# Patient Record
Sex: Female | Born: 1940 | Race: White | Hispanic: No | Marital: Married | Smoking: Never smoker
Health system: Southern US, Community
[De-identification: ages and names within clinical notes are randomized; demographics above are authoritative.]

## PROBLEM LIST (undated history)

## (undated) DIAGNOSIS — J45909 Unspecified asthma, uncomplicated: Secondary | ICD-10-CM

## (undated) DIAGNOSIS — K219 Gastro-esophageal reflux disease without esophagitis: Secondary | ICD-10-CM

## (undated) DIAGNOSIS — H409 Unspecified glaucoma: Secondary | ICD-10-CM

## (undated) DIAGNOSIS — R42 Dizziness and giddiness: Secondary | ICD-10-CM

## (undated) DIAGNOSIS — G43909 Migraine, unspecified, not intractable, without status migrainosus: Secondary | ICD-10-CM

## (undated) DIAGNOSIS — E7800 Pure hypercholesterolemia, unspecified: Secondary | ICD-10-CM

## (undated) DIAGNOSIS — E039 Hypothyroidism, unspecified: Secondary | ICD-10-CM

## (undated) DIAGNOSIS — I10 Essential (primary) hypertension: Secondary | ICD-10-CM

## (undated) DIAGNOSIS — J449 Chronic obstructive pulmonary disease, unspecified: Secondary | ICD-10-CM

## (undated) DIAGNOSIS — R0600 Dyspnea, unspecified: Secondary | ICD-10-CM

## (undated) DIAGNOSIS — M810 Age-related osteoporosis without current pathological fracture: Secondary | ICD-10-CM

## (undated) DIAGNOSIS — I4891 Unspecified atrial fibrillation: Secondary | ICD-10-CM

## (undated) HISTORY — PX: COLONOSCOPY: SHX174

## (undated) HISTORY — PX: ABLATION: SHX5711

---

## 1999-05-28 ENCOUNTER — Ambulatory Visit (HOSPITAL_COMMUNITY): Admission: RE | Admit: 1999-05-28 | Discharge: 1999-05-28 | Payer: Self-pay | Admitting: Internal Medicine

## 2004-11-05 ENCOUNTER — Ambulatory Visit: Payer: Self-pay | Admitting: Internal Medicine

## 2004-11-19 ENCOUNTER — Ambulatory Visit: Payer: Self-pay | Admitting: Obstetrics and Gynecology

## 2005-09-22 ENCOUNTER — Ambulatory Visit: Payer: Self-pay | Admitting: Internal Medicine

## 2005-12-31 ENCOUNTER — Ambulatory Visit: Payer: Self-pay | Admitting: Obstetrics and Gynecology

## 2006-03-01 ENCOUNTER — Ambulatory Visit: Payer: Self-pay

## 2007-02-17 ENCOUNTER — Ambulatory Visit: Payer: Self-pay | Admitting: Obstetrics and Gynecology

## 2008-03-24 ENCOUNTER — Ambulatory Visit: Payer: Self-pay | Admitting: Internal Medicine

## 2008-03-27 ENCOUNTER — Ambulatory Visit: Payer: Self-pay | Admitting: Obstetrics and Gynecology

## 2010-04-24 ENCOUNTER — Ambulatory Visit: Payer: Self-pay | Admitting: Unknown Physician Specialty

## 2010-08-19 ENCOUNTER — Ambulatory Visit: Payer: Self-pay | Admitting: Internal Medicine

## 2010-08-25 ENCOUNTER — Ambulatory Visit: Payer: Self-pay | Admitting: Internal Medicine

## 2010-09-02 ENCOUNTER — Ambulatory Visit: Payer: Self-pay | Admitting: Internal Medicine

## 2010-12-01 ENCOUNTER — Ambulatory Visit: Payer: Self-pay | Admitting: Internal Medicine

## 2011-04-21 ENCOUNTER — Ambulatory Visit: Payer: Self-pay | Admitting: Gastroenterology

## 2011-12-24 ENCOUNTER — Ambulatory Visit: Payer: Self-pay | Admitting: Internal Medicine

## 2013-05-12 ENCOUNTER — Ambulatory Visit: Payer: Self-pay | Admitting: Internal Medicine

## 2014-02-16 ENCOUNTER — Encounter: Payer: Self-pay | Admitting: Sports Medicine

## 2014-03-13 ENCOUNTER — Encounter: Payer: Self-pay | Admitting: Sports Medicine

## 2016-05-21 ENCOUNTER — Encounter: Payer: Self-pay | Admitting: *Deleted

## 2016-05-21 ENCOUNTER — Ambulatory Visit: Payer: Medicare Other | Admitting: Anesthesiology

## 2016-05-21 ENCOUNTER — Encounter
Admission: RE | Disposition: A | Discharge status: Home / Self Care | Payer: Self-pay | Source: Ambulatory Visit | Attending: Internal Medicine

## 2016-05-21 ENCOUNTER — Ambulatory Visit
Admission: RE | Admit: 2016-05-21 | Discharge: 2016-05-21 | Disposition: A | Discharge status: Home / Self Care | Payer: Medicare Other | Source: Ambulatory Visit | Attending: Internal Medicine | Admitting: Internal Medicine

## 2016-05-21 DIAGNOSIS — Z8249 Family history of ischemic heart disease and other diseases of the circulatory system: Secondary | ICD-10-CM | POA: Insufficient documentation

## 2016-05-21 DIAGNOSIS — E785 Hyperlipidemia, unspecified: Secondary | ICD-10-CM | POA: Diagnosis not present

## 2016-05-21 DIAGNOSIS — J449 Chronic obstructive pulmonary disease, unspecified: Secondary | ICD-10-CM | POA: Diagnosis not present

## 2016-05-21 DIAGNOSIS — I480 Paroxysmal atrial fibrillation: Secondary | ICD-10-CM | POA: Diagnosis present

## 2016-05-21 DIAGNOSIS — Z882 Allergy status to sulfonamides status: Secondary | ICD-10-CM | POA: Diagnosis not present

## 2016-05-21 DIAGNOSIS — K219 Gastro-esophageal reflux disease without esophagitis: Secondary | ICD-10-CM | POA: Insufficient documentation

## 2016-05-21 DIAGNOSIS — Z7951 Long term (current) use of inhaled steroids: Secondary | ICD-10-CM | POA: Insufficient documentation

## 2016-05-21 DIAGNOSIS — E039 Hypothyroidism, unspecified: Secondary | ICD-10-CM | POA: Insufficient documentation

## 2016-05-21 DIAGNOSIS — Z820 Family history of epilepsy and other diseases of the nervous system: Secondary | ICD-10-CM | POA: Insufficient documentation

## 2016-05-21 DIAGNOSIS — Z7901 Long term (current) use of anticoagulants: Secondary | ICD-10-CM | POA: Diagnosis not present

## 2016-05-21 DIAGNOSIS — Z888 Allergy status to other drugs, medicaments and biological substances status: Secondary | ICD-10-CM | POA: Insufficient documentation

## 2016-05-21 DIAGNOSIS — Z79899 Other long term (current) drug therapy: Secondary | ICD-10-CM | POA: Diagnosis not present

## 2016-05-21 DIAGNOSIS — I10 Essential (primary) hypertension: Secondary | ICD-10-CM | POA: Diagnosis not present

## 2016-05-21 DIAGNOSIS — M810 Age-related osteoporosis without current pathological fracture: Secondary | ICD-10-CM | POA: Diagnosis not present

## 2016-05-21 DIAGNOSIS — Z881 Allergy status to other antibiotic agents status: Secondary | ICD-10-CM | POA: Insufficient documentation

## 2016-05-21 HISTORY — DX: Unspecified asthma, uncomplicated: J45.909

## 2016-05-21 HISTORY — DX: Hypothyroidism, unspecified: E03.9

## 2016-05-21 HISTORY — DX: Dyspnea, unspecified: R06.00

## 2016-05-21 HISTORY — PX: CARDIOVERSION: EP1203

## 2016-05-21 HISTORY — DX: Pure hypercholesterolemia, unspecified: E78.00

## 2016-05-21 HISTORY — DX: Essential (primary) hypertension: I10

## 2016-05-21 HISTORY — DX: Age-related osteoporosis without current pathological fracture: M81.0

## 2016-05-21 HISTORY — DX: Unspecified atrial fibrillation: I48.91

## 2016-05-21 HISTORY — DX: Chronic obstructive pulmonary disease, unspecified: J44.9

## 2016-05-21 HISTORY — DX: Gastro-esophageal reflux disease without esophagitis: K21.9

## 2016-05-21 HISTORY — DX: Unspecified glaucoma: H40.9

## 2016-05-21 SURGERY — CARDIOVERSION (CATH LAB)
Anesthesia: General

## 2016-05-21 MED ORDER — PROPOFOL 10 MG/ML IV BOLUS
INTRAVENOUS | Status: DC | PRN
  Administered 2016-05-21: 75 mg via INTRAVENOUS

## 2016-05-21 MED ORDER — FENTANYL CITRATE (PF) 100 MCG/2ML IJ SOLN: 25.0000 ug | INTRAMUSCULAR | Status: DC | PRN

## 2016-05-21 MED ORDER — METOPROLOL SUCCINATE ER 25 MG PO TB24: 12.5000 mg | ORAL_TABLET | Freq: Two times a day (BID) | ORAL | 4 refills | Status: DC

## 2016-05-21 MED ORDER — ONDANSETRON HCL 4 MG/2ML IJ SOLN: 4.0000 mg | Freq: Once | INTRAMUSCULAR | Status: DC | PRN

## 2016-05-21 MED ORDER — FENTANYL CITRATE (PF) 100 MCG/2ML IJ SOLN
INTRAMUSCULAR | Status: AC
  Filled 2016-05-21: qty 2

## 2016-05-21 MED ORDER — SODIUM CHLORIDE 0.9 % IV SOLN
INTRAVENOUS | Status: DC
  Administered 2016-05-21 (×2): via INTRAVENOUS

## 2016-05-21 NOTE — Anesthesia Postprocedure Evaluation (Signed)
Anesthesia Post Note  Patient: Sarah Herring  Procedure(s) Performed: Procedure(s) (LRB): CARDIOVERSION (N/A)  Patient location during evaluation: PACU Anesthesia Type: General Level of consciousness: awake Pain management: pain level controlled Vital Signs Assessment: post-procedure vital signs reviewed and stable Respiratory status: spontaneous breathing Cardiovascular status: stable Anesthetic complications: no     Last Vitals:  Vitals:   05/21/16 0825 05/21/16 0830  BP: 112/62 107/82  Pulse: (!) 57 (!) 59  Resp: 17 17  Temp:      Last Pain: There were no vitals filed for this visit.               VAN STAVEREN,Braxxton Stoudt

## 2016-05-21 NOTE — Transfer of Care (Signed)
Immediate Anesthesia Transfer of Care Note  Patient: Sarah FilbertCarol S Weintraub  Procedure(s) Performed: Procedure(s): CARDIOVERSION (N/A)  Patient Location: PACU  Anesthesia Type:General  Level of Consciousness: awake and patient cooperative  Airway & Oxygen Therapy: Patient Spontanous Breathing and Patient connected to nasal cannula oxygen  Post-op Assessment: Report given to RN and Post -op Vital signs reviewed and stable  Post vital signs: Reviewed and stable  Last Vitals:  Vitals:   05/21/16 0821 05/21/16 0822  BP:    Pulse: 63 65  Resp: (!) 25 20  Temp:      Last Pain: There were no vitals filed for this visit.       Complications: No apparent anesthesia complications

## 2016-05-21 NOTE — Anesthesia Preprocedure Evaluation (Signed)
Anesthesia Evaluation  Patient identified by MRN, date of birth, ID band Patient awake    Reviewed: Allergy & Precautions  Airway Mallampati: III       Dental  (+) Caps, Teeth Intact   Pulmonary shortness of breath and with exertion,     + decreased breath sounds      Cardiovascular hypertension, Pt. on home beta blockers + dysrhythmias Atrial Fibrillation  Rhythm:Irregular Rate:Abnormal     Neuro/Psych negative neurological ROS  negative psych ROS   GI/Hepatic Neg liver ROS, GERD  Medicated,  Endo/Other  Hypothyroidism   Renal/GU      Musculoskeletal   Abdominal Normal abdominal exam  (+) + obese,   Peds  Hematology negative hematology ROS (+)   Anesthesia Other Findings   Reproductive/Obstetrics                             Anesthesia Physical Anesthesia Plan  ASA: III  Anesthesia Plan: General   Post-op Pain Management:    Induction: Intravenous  Airway Management Planned: Natural Airway and Nasal Cannula  Additional Equipment:   Intra-op Plan:   Post-operative Plan:   Informed Consent: I have reviewed the patients History and Physical, chart, labs and discussed the procedure including the risks, benefits and alternatives for the proposed anesthesia with the patient or authorized representative who has indicated his/her understanding and acceptance.     Plan Discussed with: CRNA and Surgeon  Anesthesia Plan Comments:         Anesthesia Quick Evaluation

## 2016-05-21 NOTE — CV Procedure (Signed)
Electrical Cardioversion Procedure Note Emily FilbertCarol S Wubben [id number] 04/21/1940  Procedure: Electrical Cardioversion Indications:  Atrial Fibrillation  Procedure Details Consent: Risks of procedure as well as the alternatives and risks of each were explained to the (patient/caregiver).  Consent for procedure obtained. Time Out: Verified patient identification, verified procedure, site/side was marked, verified correct patient position, special equipment/implants available, medications/allergies/relevent history reviewed, required imaging and test results available.  Performed  Patient placed on cardiac monitor, pulse oximetry, supplemental oxygen as necessary.  Sedation given: Benzodiazepines and Short-acting barbiturates Pacer pads placed anterior and posterior chest.  Cardioverted 1 time(s).  Cardioverted at 120J.  Evaluation Findings: Post procedure EKG shows: NSR Complications: None Patient did tolerate procedure well.   Lamar BlinksBruce J Kowalski 05/21/2016, 8:36 AM

## 2016-05-21 NOTE — Anesthesia Post-op Follow-up Note (Cosign Needed)
Anesthesia QCDR form completed.        

## 2017-10-21 ENCOUNTER — Ambulatory Visit: Admit: 2017-10-21 | Payer: Medicare Other | Admitting: Internal Medicine

## 2017-10-21 SURGERY — CARDIOVERSION (CATH LAB)
Anesthesia: General

## 2017-12-07 ENCOUNTER — Other Ambulatory Visit: Payer: Self-pay | Admitting: Internal Medicine

## 2017-12-07 DIAGNOSIS — G8929 Other chronic pain: Secondary | ICD-10-CM

## 2017-12-07 DIAGNOSIS — M545 Low back pain, unspecified: Secondary | ICD-10-CM

## 2017-12-22 ENCOUNTER — Encounter (INDEPENDENT_AMBULATORY_CARE_PROVIDER_SITE_OTHER): Payer: Self-pay

## 2017-12-22 ENCOUNTER — Ambulatory Visit
Admission: RE | Admit: 2017-12-22 | Discharge: 2017-12-22 | Disposition: A | Discharge status: Home / Self Care | Payer: Medicare Other | Source: Ambulatory Visit | Attending: Internal Medicine | Admitting: Internal Medicine

## 2017-12-22 DIAGNOSIS — M545 Low back pain, unspecified: Secondary | ICD-10-CM

## 2017-12-22 DIAGNOSIS — G8929 Other chronic pain: Secondary | ICD-10-CM | POA: Insufficient documentation

## 2017-12-22 DIAGNOSIS — M4854XA Collapsed vertebra, not elsewhere classified, thoracic region, initial encounter for fracture: Secondary | ICD-10-CM | POA: Diagnosis not present

## 2017-12-22 DIAGNOSIS — R29890 Loss of height: Secondary | ICD-10-CM | POA: Diagnosis not present

## 2017-12-22 MED ORDER — GADOBENATE DIMEGLUMINE 529 MG/ML IV SOLN
15.0000 mL | Freq: Once | INTRAVENOUS | Status: AC | PRN
  Administered 2017-12-22: 15 mL via INTRAVENOUS

## 2017-12-30 ENCOUNTER — Encounter: Payer: Self-pay | Admitting: *Deleted

## 2017-12-30 ENCOUNTER — Ambulatory Visit
Admission: RE | Admit: 2017-12-30 | Discharge: 2017-12-30 | Disposition: A | Discharge status: Home / Self Care | Payer: Medicare Other | Source: Ambulatory Visit | Attending: Orthopedic Surgery | Admitting: Orthopedic Surgery

## 2017-12-30 ENCOUNTER — Other Ambulatory Visit: Payer: Self-pay

## 2017-12-30 ENCOUNTER — Encounter
Admission: RE | Disposition: A | Discharge status: Home / Self Care | Payer: Self-pay | Source: Ambulatory Visit | Attending: Orthopedic Surgery

## 2017-12-30 ENCOUNTER — Ambulatory Visit: Payer: Medicare Other | Admitting: Anesthesiology

## 2017-12-30 ENCOUNTER — Ambulatory Visit: Payer: Medicare Other

## 2017-12-30 DIAGNOSIS — Z79899 Other long term (current) drug therapy: Secondary | ICD-10-CM | POA: Insufficient documentation

## 2017-12-30 DIAGNOSIS — X509XXA Other and unspecified overexertion or strenuous movements or postures, initial encounter: Secondary | ICD-10-CM | POA: Diagnosis not present

## 2017-12-30 DIAGNOSIS — I10 Essential (primary) hypertension: Secondary | ICD-10-CM | POA: Insufficient documentation

## 2017-12-30 DIAGNOSIS — Z7901 Long term (current) use of anticoagulants: Secondary | ICD-10-CM | POA: Diagnosis not present

## 2017-12-30 DIAGNOSIS — S22080A Wedge compression fracture of T11-T12 vertebra, initial encounter for closed fracture: Secondary | ICD-10-CM | POA: Diagnosis not present

## 2017-12-30 DIAGNOSIS — G4730 Sleep apnea, unspecified: Secondary | ICD-10-CM | POA: Diagnosis not present

## 2017-12-30 DIAGNOSIS — J449 Chronic obstructive pulmonary disease, unspecified: Secondary | ICD-10-CM | POA: Diagnosis not present

## 2017-12-30 DIAGNOSIS — Z79890 Hormone replacement therapy (postmenopausal): Secondary | ICD-10-CM | POA: Diagnosis not present

## 2017-12-30 DIAGNOSIS — E039 Hypothyroidism, unspecified: Secondary | ICD-10-CM | POA: Diagnosis not present

## 2017-12-30 DIAGNOSIS — Z419 Encounter for procedure for purposes other than remedying health state, unspecified: Secondary | ICD-10-CM

## 2017-12-30 DIAGNOSIS — E119 Type 2 diabetes mellitus without complications: Secondary | ICD-10-CM | POA: Insufficient documentation

## 2017-12-30 DIAGNOSIS — E785 Hyperlipidemia, unspecified: Secondary | ICD-10-CM | POA: Insufficient documentation

## 2017-12-30 HISTORY — PX: KYPHOPLASTY: SHX5884

## 2017-12-30 SURGERY — KYPHOPLASTY
Anesthesia: Monitor Anesthesia Care | Site: Back

## 2017-12-30 MED ORDER — LACTATED RINGERS IV SOLN
INTRAVENOUS | Status: DC
  Administered 2017-12-30: 13:00:00 via INTRAVENOUS

## 2017-12-30 MED ORDER — BUPIVACAINE-EPINEPHRINE (PF) 0.5% -1:200000 IJ SOLN
INTRAMUSCULAR | Status: AC
  Filled 2017-12-30: qty 30

## 2017-12-30 MED ORDER — IOPAMIDOL (ISOVUE-M 200) INJECTION 41%
INTRAMUSCULAR | Status: AC
  Filled 2017-12-30: qty 20

## 2017-12-30 MED ORDER — IOPAMIDOL (ISOVUE-M 200) INJECTION 41%
INTRAMUSCULAR | Status: DC | PRN
  Administered 2017-12-30: 20 mL

## 2017-12-30 MED ORDER — MIDAZOLAM HCL 2 MG/2ML IJ SOLN
INTRAMUSCULAR | Status: AC
  Filled 2017-12-30: qty 2

## 2017-12-30 MED ORDER — PROPOFOL 10 MG/ML IV BOLUS
INTRAVENOUS | Status: DC | PRN
  Administered 2017-12-30 (×3): 20 mg via INTRAVENOUS

## 2017-12-30 MED ORDER — PROPOFOL 500 MG/50ML IV EMUL
INTRAVENOUS | Status: AC
  Filled 2017-12-30: qty 50

## 2017-12-30 MED ORDER — CEFAZOLIN SODIUM-DEXTROSE 2-4 GM/100ML-% IV SOLN
INTRAVENOUS | Status: AC
  Filled 2017-12-30: qty 100

## 2017-12-30 MED ORDER — LIDOCAINE HCL 1 % IJ SOLN
INTRAMUSCULAR | Status: DC | PRN
  Administered 2017-12-30: 30 mL

## 2017-12-30 MED ORDER — CEFAZOLIN SODIUM-DEXTROSE 2-4 GM/100ML-% IV SOLN
2.0000 g | Freq: Once | INTRAVENOUS | Status: AC
  Administered 2017-12-30: 2 g via INTRAVENOUS

## 2017-12-30 MED ORDER — MIDAZOLAM HCL 2 MG/2ML IJ SOLN
INTRAMUSCULAR | Status: DC | PRN
  Administered 2017-12-30 (×2): 1 mg via INTRAVENOUS

## 2017-12-30 MED ORDER — LIDOCAINE HCL (CARDIAC) PF 100 MG/5ML IV SOSY
PREFILLED_SYRINGE | INTRAVENOUS | Status: DC | PRN
  Administered 2017-12-30: 50 mg via INTRAVENOUS

## 2017-12-30 MED ORDER — ONDANSETRON HCL 4 MG/2ML IJ SOLN: 4.0000 mg | Freq: Once | INTRAMUSCULAR | Status: DC | PRN

## 2017-12-30 MED ORDER — BUPIVACAINE-EPINEPHRINE (PF) 0.5% -1:200000 IJ SOLN
INTRAMUSCULAR | Status: DC | PRN
  Administered 2017-12-30: 20 mL

## 2017-12-30 MED ORDER — LIDOCAINE HCL (PF) 1 % IJ SOLN
INTRAMUSCULAR | Status: AC
  Filled 2017-12-30: qty 30

## 2017-12-30 MED ORDER — PROPOFOL 500 MG/50ML IV EMUL
INTRAVENOUS | Status: DC | PRN
  Administered 2017-12-30: 50 ug/kg/min via INTRAVENOUS

## 2017-12-30 MED ORDER — HYDROCODONE-ACETAMINOPHEN 5-325 MG PO TABS: 1.0000 | ORAL_TABLET | Freq: Four times a day (QID) | ORAL | 0 refills | Status: DC | PRN

## 2017-12-30 MED ORDER — FENTANYL CITRATE (PF) 100 MCG/2ML IJ SOLN: 25.0000 ug | INTRAMUSCULAR | Status: DC | PRN

## 2017-12-30 SURGICAL SUPPLY — 16 items
CEMENT KYPHON CX01A KIT/MIXER (Cement) ×2 IMPLANT
DERMABOND ADVANCED (GAUZE/BANDAGES/DRESSINGS) ×1
DERMABOND ADVANCED .7 DNX12 (GAUZE/BANDAGES/DRESSINGS) ×1 IMPLANT
DEVICE BIOPSY BONE KYPHX (INSTRUMENTS) ×2 IMPLANT
DRAPE C-ARM XRAY 36X54 (DRAPES) ×2 IMPLANT
DURAPREP 26ML APPLICATOR (WOUND CARE) ×2 IMPLANT
GLOVE SURG SYN 9.0  PF PI (GLOVE) ×1
GLOVE SURG SYN 9.0 PF PI (GLOVE) ×1 IMPLANT
GOWN SRG 2XL LVL 4 RGLN SLV (GOWNS) ×1 IMPLANT
GOWN STRL NON-REIN 2XL LVL4 (GOWNS) ×1
GOWN STRL REUS W/ TWL LRG LVL3 (GOWN DISPOSABLE) ×1 IMPLANT
GOWN STRL REUS W/TWL LRG LVL3 (GOWN DISPOSABLE) ×1
PACK KYPHOPLASTY (MISCELLANEOUS) ×2 IMPLANT
STRAP SAFETY 5IN WIDE (MISCELLANEOUS) ×2 IMPLANT
TRAY KYPHOPAK 15/3 EXPRESS 1ST (MISCELLANEOUS) ×2 IMPLANT
TRAY KYPHOPAK 20/3 EXPRESS 1ST (MISCELLANEOUS) ×1 IMPLANT

## 2017-12-30 NOTE — Transfer of Care (Signed)
Immediate Anesthesia Transfer of Care Note  Patient: Sarah Herring  Procedure(s) Performed: Coralyn Helling (N/A Back)  Patient Location: PACU  Anesthesia Type:MAC  Level of Consciousness: awake  Airway & Oxygen Therapy: Patient Spontanous Breathing  Post-op Assessment: Report given to RN  Post vital signs: stable  Last Vitals:  Vitals Value Taken Time  BP 161/71 12/30/2017  2:07 PM  Temp 36.4 C 12/30/2017  2:07 PM  Pulse 62 12/30/2017  2:13 PM  Resp 11 12/30/2017  2:13 PM  SpO2 98 % 12/30/2017  2:13 PM  Vitals shown include unvalidated device data.  Last Pain:  Vitals:   12/30/17 1407  TempSrc:   PainSc: 0-No pain         Complications: No apparent anesthesia complications

## 2017-12-30 NOTE — Op Note (Signed)
12/30/2017  2:10 PM  PATIENT:  Sarah Herring  77 y.o. female  PRE-OPERATIVE DIAGNOSIS:  CLOSED WEDGE COMPRESSION FRACTURE OF TWELFTH [name]  POST-OPERATIVE DIAGNOSIS:  CLOSED WEDGE COMPRESSION FRACTURE OF TWELFTH [name]  PROCEDURE:  Procedure(s): KYPHOPLASTY-T12 (N/A)  SURGEON: Laurene Footman, MD  ASSISTANTS: None  ANESTHESIA:   local and MAC  EBL:  No intake/output data recorded.  BLOOD ADMINISTERED:none  DRAINS: none   LOCAL MEDICATIONS USED:  MARCAINE    and XYLOCAINE   SPECIMEN:  Source of Specimen:  T12 vertebral body  DISPOSITION OF SPECIMEN:  PATHOLOGY  COUNTS:  YES  TOURNIQUET:  * No tourniquets in log *  IMPLANTS: Bone cement  DICTATION: .Dragon Dictation patient was brought to the operating room and after adequate anesthesia was obtained the patient was placed prone.  After patient identification and timeout procedure with good visualization of T12 with both AP and lateral C arm views 5 cc 1% Xylocaine is infiltrated bilaterally subcutaneously.  The back was prepped and draped you sterile fashion and repeat timeout procedure carried out.  A 50-50 mix of 1% Xylocaine half percent Sensorcaine was infiltrated down the pedicle on the right and left sides of T12.  A small incision was made and trocar was advanced in a perpendicular alignment.  Biopsy was obtained drilling carried out and balloons were inflated on both sides.  About 2-1/2 cc of balloon inflation.  Cement was mixed and approximately 3 cc were used to fill the vertebral body from inferior endplate to superior with a small amount extravasation into the T11-T12 disc space.  There is good fill from medial to lateral as well on both sides.  When the cement was adequately set the trochars removed and Dermabond used to close the skin  PLAN OF CARE: Discharge to home after PACU  PATIENT DISPOSITION:  PACU - hemodynamically stable.

## 2017-12-30 NOTE — Discharge Instructions (Addendum)
Take it easy today and tomorrow.  Remove Band-Aids Saturday and then resume more normal activities if tolerated.  Okay to shower starting Saturday.  Pain medicine as directed. Resume home medications tonight.  AMBULATORY SURGERY  DISCHARGE INSTRUCTIONS   1) The drugs that you were given will stay in your system until tomorrow so for the next 24 hours you should not:  A) Drive an automobile B) Make any legal decisions C) Drink any alcoholic beverage   2) You may resume regular meals tomorrow.  Today it is better to start with liquids and gradually work up to solid foods.  You may eat anything you prefer, but it is better to start with liquids, then soup and crackers, and gradually work up to solid foods.   3) Please notify your doctor immediately if you have any unusual bleeding, trouble breathing, redness and pain at the surgery site, drainage, fever, or pain not relieved by medication.    4) Additional Instructions:        Please contact your physician with any problems or Same Day Surgery at [phone], Monday through Friday 6 am to 4 pm, or [place] at [facility] Main number at [phone].

## 2017-12-30 NOTE — Anesthesia Preprocedure Evaluation (Signed)
Anesthesia Evaluation  Patient identified by MRN, date of birth, ID band Patient awake    Reviewed: Allergy & Precautions, H&P , NPO status , Patient's Chart, lab work & pertinent test results, reviewed documented beta blocker date and time   Airway Mallampati: II  TM Distance: >3 FB Neck ROM: full    Dental no notable dental hx. (+) Teeth Intact   Pulmonary neg pulmonary ROS, shortness of breath and with exertion, asthma , COPD,    Pulmonary exam normal breath sounds clear to auscultation       Cardiovascular Exercise Tolerance: Poor hypertension, On Medications negative cardio ROS  Atrial Fibrillation  Rhythm:regular Rate:Normal     Neuro/Psych negative neurological ROS  negative psych ROS   GI/Hepatic negative GI ROS, Neg liver ROS, GERD  ,  Endo/Other  negative endocrine ROSdiabetesHypothyroidism   Renal/GU      Musculoskeletal   Abdominal   Peds  Hematology negative hematology ROS (+)   Anesthesia Other Findings   Reproductive/Obstetrics negative OB ROS                             Anesthesia Physical Anesthesia Plan  ASA: III  Anesthesia Plan: MAC   Post-op Pain Management:    Induction:   PONV Risk Score and Plan:   Airway Management Planned:   Additional Equipment:   Intra-op Plan:   Post-operative Plan:   Informed Consent: I have reviewed the patients History and Physical, chart, labs and discussed the procedure including the risks, benefits and alternatives for the proposed anesthesia with the patient or authorized representative who has indicated his/her understanding and acceptance.     Plan Discussed with: CRNA  Anesthesia Plan Comments:         Anesthesia Quick Evaluation

## 2017-12-30 NOTE — Anesthesia Post-op Follow-up Note (Signed)
Anesthesia QCDR form completed.        

## 2017-12-30 NOTE — H&P (Signed)
Reviewed paper H+P, will be scanned into chart. No changes noted.  

## 2017-12-30 NOTE — Anesthesia Postprocedure Evaluation (Signed)
Anesthesia Post Note  Patient: Sarah Herring  Procedure(s) Performed: Coralyn Helling (N/A Back)  Patient location during evaluation: PACU Anesthesia Type: MAC Level of consciousness: awake and alert Pain management: pain level controlled Vital Signs Assessment: post-procedure vital signs reviewed and stable Respiratory status: spontaneous breathing, nonlabored ventilation, respiratory function stable and patient connected to nasal cannula oxygen Cardiovascular status: blood pressure returned to baseline and stable Postop Assessment: no apparent nausea or vomiting Anesthetic complications: no     Last Vitals:  Vitals:   12/30/17 1202 12/30/17 1407  BP: (!) 171/76 (!) 161/71  Pulse: (!) 51 65  Resp: 20 12  Temp: (!) 35.6 C 36.4 C  SpO2: 100% 96%    Last Pain:  Vitals:   12/30/17 1407  TempSrc:   PainSc: 0-No pain                 Molli Barrows

## 2017-12-31 ENCOUNTER — Encounter: Payer: Self-pay | Admitting: Orthopedic Surgery

## 2018-01-03 LAB — SURGICAL PATHOLOGY

## 2018-03-21 ENCOUNTER — Other Ambulatory Visit: Payer: Self-pay | Admitting: Internal Medicine

## 2018-03-21 DIAGNOSIS — R1011 Right upper quadrant pain: Secondary | ICD-10-CM

## 2018-03-25 ENCOUNTER — Ambulatory Visit
Admission: RE | Admit: 2018-03-25 | Discharge: 2018-03-25 | Disposition: A | Discharge status: Home / Self Care | Payer: Medicare Other | Source: Ambulatory Visit | Attending: Internal Medicine | Admitting: Internal Medicine

## 2018-03-25 DIAGNOSIS — I10 Essential (primary) hypertension: Secondary | ICD-10-CM | POA: Diagnosis present

## 2018-03-25 DIAGNOSIS — R1011 Right upper quadrant pain: Secondary | ICD-10-CM | POA: Insufficient documentation

## 2018-04-14 ENCOUNTER — Other Ambulatory Visit: Payer: Self-pay | Admitting: Gastroenterology

## 2018-04-14 DIAGNOSIS — R1032 Left lower quadrant pain: Secondary | ICD-10-CM

## 2018-04-20 ENCOUNTER — Ambulatory Visit: Payer: Medicare Other

## 2018-04-22 ENCOUNTER — Ambulatory Visit
Admission: RE | Admit: 2018-04-22 | Discharge: 2018-04-22 | Disposition: A | Discharge status: Home / Self Care | Payer: Medicare Other | Source: Ambulatory Visit | Attending: Gastroenterology | Admitting: Gastroenterology

## 2018-04-22 DIAGNOSIS — R1032 Left lower quadrant pain: Secondary | ICD-10-CM | POA: Insufficient documentation

## 2018-04-22 MED ORDER — IOPAMIDOL (ISOVUE-300) INJECTION 61%
80.0000 mL | Freq: Once | INTRAVENOUS | Status: AC | PRN
  Administered 2018-04-22: 80 mL via INTRAVENOUS

## 2018-05-02 ENCOUNTER — Other Ambulatory Visit
Admission: RE | Admit: 2018-05-02 | Discharge: 2018-05-02 | Disposition: A | Discharge status: Home / Self Care | Payer: Medicare Other | Source: Ambulatory Visit | Attending: Gastroenterology | Admitting: Gastroenterology

## 2018-05-02 DIAGNOSIS — R109 Unspecified abdominal pain: Secondary | ICD-10-CM | POA: Diagnosis present

## 2018-05-02 LAB — GASTROINTESTINAL PANEL BY PCR, STOOL (REPLACES STOOL CULTURE)

## 2018-05-02 LAB — C DIFFICILE QUICK SCREEN W PCR REFLEX
C Diff antigen: NEGATIVE
C Diff interpretation: NOT DETECTED
C Diff toxin: NEGATIVE

## 2018-05-03 ENCOUNTER — Other Ambulatory Visit: Payer: Self-pay | Admitting: Gastroenterology

## 2018-05-03 DIAGNOSIS — R9389 Abnormal findings on diagnostic imaging of other specified body structures: Secondary | ICD-10-CM

## 2018-05-11 ENCOUNTER — Emergency Department: Payer: Medicare Other

## 2018-05-11 ENCOUNTER — Observation Stay
Admission: EM | Admit: 2018-05-11 | Discharge: 2018-05-12 | Disposition: A | Discharge status: Home / Self Care | Payer: Medicare Other | Attending: Internal Medicine | Admitting: Internal Medicine

## 2018-05-11 ENCOUNTER — Other Ambulatory Visit: Payer: Self-pay

## 2018-05-11 ENCOUNTER — Encounter: Payer: Self-pay | Admitting: Emergency Medicine

## 2018-05-11 DIAGNOSIS — I499 Cardiac arrhythmia, unspecified: Secondary | ICD-10-CM | POA: Diagnosis present

## 2018-05-11 DIAGNOSIS — Z79890 Hormone replacement therapy (postmenopausal): Secondary | ICD-10-CM | POA: Diagnosis not present

## 2018-05-11 DIAGNOSIS — E039 Hypothyroidism, unspecified: Secondary | ICD-10-CM | POA: Insufficient documentation

## 2018-05-11 DIAGNOSIS — I5031 Acute diastolic (congestive) heart failure: Secondary | ICD-10-CM | POA: Insufficient documentation

## 2018-05-11 DIAGNOSIS — K219 Gastro-esophageal reflux disease without esophagitis: Secondary | ICD-10-CM | POA: Diagnosis not present

## 2018-05-11 DIAGNOSIS — M810 Age-related osteoporosis without current pathological fracture: Secondary | ICD-10-CM | POA: Diagnosis not present

## 2018-05-11 DIAGNOSIS — J449 Chronic obstructive pulmonary disease, unspecified: Secondary | ICD-10-CM | POA: Insufficient documentation

## 2018-05-11 DIAGNOSIS — I472 Ventricular tachycardia: Secondary | ICD-10-CM

## 2018-05-11 DIAGNOSIS — I081 Rheumatic disorders of both mitral and tricuspid valves: Secondary | ICD-10-CM | POA: Diagnosis not present

## 2018-05-11 DIAGNOSIS — R000 Tachycardia, unspecified: Secondary | ICD-10-CM | POA: Diagnosis present

## 2018-05-11 DIAGNOSIS — Z79899 Other long term (current) drug therapy: Secondary | ICD-10-CM | POA: Diagnosis not present

## 2018-05-11 DIAGNOSIS — I110 Hypertensive heart disease with heart failure: Secondary | ICD-10-CM | POA: Insufficient documentation

## 2018-05-11 DIAGNOSIS — Z8249 Family history of ischemic heart disease and other diseases of the circulatory system: Secondary | ICD-10-CM | POA: Insufficient documentation

## 2018-05-11 DIAGNOSIS — H409 Unspecified glaucoma: Secondary | ICD-10-CM | POA: Insufficient documentation

## 2018-05-11 DIAGNOSIS — Z9889 Other specified postprocedural states: Secondary | ICD-10-CM

## 2018-05-11 DIAGNOSIS — Z7901 Long term (current) use of anticoagulants: Secondary | ICD-10-CM | POA: Insufficient documentation

## 2018-05-11 DIAGNOSIS — Z882 Allergy status to sulfonamides status: Secondary | ICD-10-CM | POA: Diagnosis not present

## 2018-05-11 DIAGNOSIS — I4891 Unspecified atrial fibrillation: Secondary | ICD-10-CM

## 2018-05-11 DIAGNOSIS — N179 Acute kidney failure, unspecified: Secondary | ICD-10-CM | POA: Diagnosis not present

## 2018-05-11 DIAGNOSIS — I480 Paroxysmal atrial fibrillation: Secondary | ICD-10-CM | POA: Diagnosis not present

## 2018-05-11 DIAGNOSIS — E7800 Pure hypercholesterolemia, unspecified: Secondary | ICD-10-CM | POA: Diagnosis not present

## 2018-05-11 LAB — BASIC METABOLIC PANEL
Anion gap: 10 (ref 5–15)
BUN: 19 mg/dL (ref 8–23)
CO2: 24 mmol/L (ref 22–32)
CREATININE: 1.11 mg/dL — AB (ref 0.44–1.00)
Calcium: 9 mg/dL (ref 8.9–10.3)
Chloride: 98 mmol/L (ref 98–111)
GFR calc Af Amer: 55 mL/min — ABNORMAL LOW (ref >60)
GFR calc non Af Amer: 47 mL/min — ABNORMAL LOW (ref >60)
Glucose, Bld: 100 mg/dL — ABNORMAL HIGH (ref 70–99)
Potassium: 4.1 mmol/L (ref 3.5–5.1)
Sodium: 132 mmol/L — ABNORMAL LOW (ref 135–145)

## 2018-05-11 LAB — CBC
HCT: 43.4 % (ref 36.0–46.0)
Hemoglobin: 14.3 g/dL (ref 12.0–15.0)
MCH: 30.5 pg (ref 26.0–34.0)
MCHC: 32.9 g/dL (ref 30.0–36.0)
MCV: 92.5 fL (ref 80.0–100.0)
Platelets: 335 10*3/uL (ref 150–400)
RBC: 4.69 MIL/uL (ref 3.87–5.11)
RDW: 12.5 % (ref 11.5–15.5)
WBC: 11.6 10*3/uL — AB (ref 4.0–10.5)
nRBC: 0 % (ref 0.0–0.2)

## 2018-05-11 LAB — MAGNESIUM: Magnesium: 2 mg/dL (ref 1.7–2.4)

## 2018-05-11 LAB — TROPONIN I
TROPONIN I: 0.39 ng/mL — AB (ref <0.03)
Troponin I: 0.52 ng/mL (ref <0.03)

## 2018-05-11 LAB — BRAIN NATRIURETIC PEPTIDE: B Natriuretic Peptide: 916 pg/mL — ABNORMAL HIGH (ref 0.0–100.0)

## 2018-05-11 LAB — T4, FREE: Free T4: 1.18 ng/dL (ref 0.82–1.77)

## 2018-05-11 LAB — TSH: TSH: 7.814 u[IU]/mL — ABNORMAL HIGH (ref 0.350–4.500)

## 2018-05-11 MED ORDER — LEVOTHYROXINE SODIUM 112 MCG PO TABS
112.0000 ug | ORAL_TABLET | Freq: Every day | ORAL | Status: DC
  Administered 2018-05-11: 112 ug via ORAL
  Filled 2018-05-11 (×2): qty 1

## 2018-05-11 MED ORDER — ADULT MULTIVITAMIN W/MINERALS CH
1.0000 | ORAL_TABLET | Freq: Every day | ORAL | Status: DC
  Administered 2018-05-12: 1 via ORAL
  Filled 2018-05-11: qty 1

## 2018-05-11 MED ORDER — MAGNESIUM SULFATE 2 GM/50ML IV SOLN
2.0000 g | Freq: Once | INTRAVENOUS | Status: AC
  Administered 2018-05-11: 2 g via INTRAVENOUS
  Filled 2018-05-11: qty 50

## 2018-05-11 MED ORDER — APIXABAN 5 MG PO TABS
5.0000 mg | ORAL_TABLET | Freq: Two times a day (BID) | ORAL | Status: DC
  Administered 2018-05-11 – 2018-05-12 (×2): 5 mg via ORAL
  Filled 2018-05-11 (×2): qty 1

## 2018-05-11 MED ORDER — FUROSEMIDE 40 MG PO TABS
40.0000 mg | ORAL_TABLET | Freq: Every day | ORAL | Status: DC
  Filled 2018-05-11: qty 1

## 2018-05-11 MED ORDER — ATORVASTATIN CALCIUM 20 MG PO TABS
20.0000 mg | ORAL_TABLET | Freq: Every day | ORAL | Status: DC
  Administered 2018-05-11 – 2018-05-12 (×2): 20 mg via ORAL
  Filled 2018-05-11 (×2): qty 1

## 2018-05-11 MED ORDER — POLYVINYL ALCOHOL 1.4 % OP SOLN
1.0000 [drp] | Freq: Two times a day (BID) | OPHTHALMIC | Status: DC | PRN
  Filled 2018-05-11: qty 15

## 2018-05-11 MED ORDER — PANTOPRAZOLE SODIUM 40 MG PO TBEC
40.0000 mg | DELAYED_RELEASE_TABLET | Freq: Every day | ORAL | Status: DC
  Administered 2018-05-12: 40 mg via ORAL
  Filled 2018-05-11: qty 1

## 2018-05-11 MED ORDER — MAGNESIUM OXIDE 400 (241.3 MG) MG PO TABS
400.0000 mg | ORAL_TABLET | Freq: Two times a day (BID) | ORAL | Status: DC
  Administered 2018-05-11 – 2018-05-12 (×2): 400 mg via ORAL
  Filled 2018-05-11 (×2): qty 1

## 2018-05-11 MED ORDER — DOCUSATE SODIUM 100 MG PO CAPS: 100.0000 mg | ORAL_CAPSULE | Freq: Two times a day (BID) | ORAL | Status: DC | PRN

## 2018-05-11 MED ORDER — ADENOSINE 12 MG/4ML IV SOLN
INTRAVENOUS | Status: AC
  Filled 2018-05-11: qty 4

## 2018-05-11 MED ORDER — ADENOSINE 6 MG/2ML IV SOLN
INTRAVENOUS | Status: AC
  Filled 2018-05-11: qty 2

## 2018-05-11 MED ORDER — ETOMIDATE 2 MG/ML IV SOLN
10.0000 mg | Freq: Once | INTRAVENOUS | Status: AC
  Administered 2018-05-11: 10 mg via INTRAVENOUS
  Filled 2018-05-11: qty 10

## 2018-05-11 MED ORDER — SODIUM CHLORIDE 0.9 % IV SOLN
INTRAVENOUS | Status: DC
  Administered 2018-05-11 – 2018-05-12 (×2): via INTRAVENOUS

## 2018-05-11 MED ORDER — DILTIAZEM HCL ER COATED BEADS 180 MG PO CP24
180.0000 mg | ORAL_CAPSULE | Freq: Every day | ORAL | Status: DC
  Filled 2018-05-11: qty 1

## 2018-05-11 MED ORDER — LATANOPROST 0.005 % OP SOLN
1.0000 [drp] | Freq: Every day | OPHTHALMIC | Status: DC
  Administered 2018-05-11: 1 [drp] via OPHTHALMIC
  Filled 2018-05-11: qty 2.5

## 2018-05-11 MED ORDER — SODIUM CHLORIDE 0.9% FLUSH
3.0000 mL | Freq: Two times a day (BID) | INTRAVENOUS | Status: DC
  Administered 2018-05-11 – 2018-05-12 (×2): 3 mL via INTRAVENOUS

## 2018-05-11 MED ORDER — SODIUM CHLORIDE 0.9 % IV SOLN
Freq: Once | INTRAVENOUS | Status: AC
  Administered 2018-05-11: 19:00:00 via INTRAVENOUS

## 2018-05-11 MED ORDER — ACETAMINOPHEN 500 MG PO TABS: 1000.0000 mg | ORAL_TABLET | Freq: Four times a day (QID) | ORAL | Status: DC | PRN

## 2018-05-11 MED ORDER — CALCIUM CARBONATE-VITAMIN D 500-200 MG-UNIT PO TABS
1.0000 | ORAL_TABLET | Freq: Every day | ORAL | Status: DC
  Administered 2018-05-12: 1 via ORAL
  Filled 2018-05-11: qty 1

## 2018-05-11 MED ORDER — SODIUM CHLORIDE 0.9% FLUSH
3.0000 mL | Freq: Once | INTRAVENOUS | Status: AC
  Administered 2018-05-11: 3 mL via INTRAVENOUS

## 2018-05-11 NOTE — ED Notes (Signed)
Heart rate has decreased into the low 50s . MS aware. Pr continues to be awake and alert.

## 2018-05-11 NOTE — ED Notes (Signed)
Pads placed on pt per Dr. Alphonzo Lemmings

## 2018-05-11 NOTE — ED Triage Notes (Signed)
Pt c/o heart racing and CP, states HR 150s at home. CP described as tight, hx of a-fib , HR 157 in traige . Pt speaking in full sentences

## 2018-05-11 NOTE — ED Notes (Signed)
Pt given ginger ale to drink. 

## 2018-05-11 NOTE — Progress Notes (Signed)
Family Meeting Note  Advance Directive:yes  Today a meeting took place with the Patient, spouse and daughter.  The following clinical team members were present during this meeting:MD  The following were discussed:Patient's diagnosis: Wide-complex tachycardia, paroxysmal atrial fibrillation, hypertension, hypothyroidism, Patient's progosis: Unable to determine and Goals for treatment: Full Code  Additional follow-up to be provided: cardiologist.  Time spent during discussion:20 minutes  Altamese Dilling, MD

## 2018-05-11 NOTE — H&P (Addendum)
[facility] - [place] at [facility]   PATIENT NAME: Sarah Herring    MR#:  [id number]  DATE OF BIRTH:  08/01/1940  DATE OF ADMISSION:  05/11/2018  PRIMARY CARE PHYSICIAN: Barbette ReichmannHande, Vishwanath, MD   REQUESTING/REFERRING PHYSICIAN: McShane   CHIEF COMPLAINT:   Chief Complaint  Patient presents with  . Chest Pain    HISTORY OF PRESENT ILLNESS: Sarah Herring  is a 78 y.o. female with a known history of asthma, atrial fibrillation, COPD, dyspnea, gastroesophageal reflux disease, hypercholesterolemia, hypertension, hypothyroidism, osteoporosis-is compliant to her medications as advised.  She [facility] and since then she is also on sotalol.  She was advised to take Cardizem as needed for her episodes of tachycardia and palpitation. For last 1 to 2 days she is feeling palpitation again and she took Cardizem yesterday and today.  Still persisted to have tachycardia so decided to come to emergency room. \She was noted to have tachycardia with wide QRS with heart rate of 150. ER physician discussed with cardiologist Dr. Fritz PickerelFath-he suggested to cardiovert the patient and admit for monitoring. After cardioversion patient converted to sinus rhythm but slightly on the bradycardia side ranging from 40-55, asymptomatic, blood pressure stable.  PAST MEDICAL HISTORY:   Past Medical History:  Diagnosis Date  . Asthma   . Atrial fibrillation (HCC)   . COPD (chronic obstructive pulmonary disease) (HCC)   . Dyspnea   . GERD (gastroesophageal reflux disease)   . Glaucoma   . Hypercholesteremia   . Hypertension   . Hypothyroidism   . Osteoporosis     PAST SURGICAL HISTORY:  Past Surgical History:  Procedure Laterality Date  . ABLATION    . CARDIOVERSION N/A 05/21/2016   Procedure: CARDIOVERSION;  Surgeon: Lamar BlinksBruce J Kowalski, MD;  Location: [facility] ORS;  Service: Cardiovascular;  Laterality: N/A;  . COLONOSCOPY    . KYPHOPLASTY N/A 12/30/2017    Procedure: [id number]-T12;  Surgeon: Kennedy BuckerMenz, Michael, MD;  Location: [facility] ORS;  Service: Orthopedics;  Laterality: N/A;    SOCIAL HISTORY:  Social History   Tobacco Use  . Smoking status: Never Smoker  Substance Use Topics  . Alcohol use: Not on file    FAMILY HISTORY:  Family History  Problem Relation Age of Onset  . Heart failure Father     DRUG ALLERGIES:  Allergies  Allergen Reactions  . Sulfur Itching and Swelling    REVIEW OF SYSTEMS:   CONSTITUTIONAL: No fever, fatigue or weakness.  EYES: No blurred or double vision.  EARS, NOSE, AND THROAT: No tinnitus or ear pain.  RESPIRATORY: No cough, shortness of breath, wheezing or hemoptysis.  CARDIOVASCULAR: No chest pain, orthopnea, edema.  GASTROINTESTINAL: No nausea, vomiting, diarrhea or abdominal pain.  GENITOURINARY: No dysuria, hematuria.  ENDOCRINE: No polyuria, nocturia,  HEMATOLOGY: No anemia, easy bruising or bleeding SKIN: No rash or lesion. MUSCULOSKELETAL: No joint pain or arthritis.   NEUROLOGIC: No tingling, numbness, weakness.  PSYCHIATRY: No anxiety or depression.   MEDICATIONS AT HOME:  Prior to Admission medications   Medication Sig Start Date End Date Taking? Authorizing Provider  acetaminophen (TYLENOL) 500 MG tablet Take 1,000 mg by mouth every 6 (six) hours as needed for moderate pain or headache.    Yes [provider]  apixaban (ELIQUIS) 5 MG TABS tablet Take 5 mg by mouth 2 (two) times daily.   Yes [provider]  atorvastatin (LIPITOR) 20 MG tablet Take 20 mg by mouth daily.  Yes [provider]  Calcium Carb-Cholecalciferol (CALCIUM 600/VITAMIN D3 PO) Take 1 tablet by mouth daily.   Yes [provider]  Cholecalciferol (VITAMIN D) 2000 units CAPS Take 4,000 Units by mouth daily.    Yes [provider]  diltiazem (CARDIZEM CD) 180 MG 24 hr capsule Take 180 mg by mouth daily. 12/08/17  Yes [provider]  latanoprost (XALATAN) 0.005 %  ophthalmic solution Place 1 drop into both eyes at bedtime.   Yes [provider]  levothyroxine (SYNTHROID, LEVOTHROID) 112 MCG tablet Take 112 mcg by mouth daily before breakfast.   Yes [provider]  magnesium oxide (MAG-OX) 400 MG tablet Take 400 mg by mouth 2 (two) times daily.    Yes [provider]  Multiple Vitamin (MULTIVITAMIN WITH MINERALS) TABS tablet Take 1 tablet by mouth daily.   Yes [provider]  omeprazole (PRILOSEC) 20 MG capsule Take 20 mg by mouth daily.   Yes [provider]  Polyvinyl Alcohol-Povidone PF (REFRESH) 1.4-0.6 % SOLN Place 1 drop into both eyes 2 (two) times daily as needed (for dry eyes).   Yes [provider]  sotalol (BETAPACE) 80 MG tablet Take 80 mg by mouth 2 (two) times daily.   Yes [provider]  furosemide (LASIX) 40 MG tablet Take 40 mg by mouth daily.    [provider]  HYDROcodone-acetaminophen (NORCO) 5-325 MG tablet Take 1 tablet by mouth every 6 (six) hours as needed for moderate pain. Patient not taking: Reported on 05/11/2018 12/30/17   Kennedy Bucker, MD      PHYSICAL EXAMINATION:   VITAL SIGNS: Blood pressure (!) 104/55, pulse (!) 51, temperature 98 F (36.7 C), temperature source Oral, resp. rate 18, SpO2 100 %.  GENERAL:  78 y.o.-year-old patient lying in the bed with no acute distress.  EYES: Pupils equal, round, reactive to light and accommodation. No scleral icterus. Extraocular muscles intact.  HEENT: Head atraumatic, normocephalic. Oropharynx and nasopharynx clear.  NECK:  Supple, no jugular venous distention. No thyroid enlargement, no tenderness.  LUNGS: Normal breath sounds bilaterally, no wheezing, rales,rhonchi or crepitation. No use of accessory muscles of respiration.  CARDIOVASCULAR: S1, S2 normal- slow. No murmurs, rubs, or gallops.  ABDOMEN: Soft, nontender, nondistended. Bowel sounds present. No organomegaly or mass.  EXTREMITIES: No pedal edema,  cyanosis, or clubbing.  NEUROLOGIC: Cranial nerves II through XII are intact. Muscle strength 5/5 in all extremities. Sensation intact. Gait not checked.  PSYCHIATRIC: The patient is alert and oriented x 3.  SKIN: No obvious rash, lesion, or ulcer.   LABORATORY PANEL:   CBC Recent Labs  Lab 05/11/18 1730  WBC 11.6*  HGB 14.3  HCT 43.4  PLT 335  MCV 92.5  MCH 30.5  MCHC 32.9  RDW 12.5   ------------------------------------------------------------------------------------------------------------------  Chemistries  Recent Labs  Lab 05/11/18 1730  NA 132*  K 4.1  CL 98  CO2 24  GLUCOSE 100*  BUN 19  CREATININE 1.11*  CALCIUM 9.0  MG 2.0   ------------------------------------------------------------------------------------------------------------------ CrCl cannot be calculated (Unknown ideal weight.). ------------------------------------------------------------------------------------------------------------------ Recent Labs    05/11/18 1730  TSH 7.814*     Coagulation profile No results for input(s): INR, PROTIME in the last 168 hours. ------------------------------------------------------------------------------------------------------------------- No results for input(s): DDIMER in the last 72 hours. -------------------------------------------------------------------------------------------------------------------  Cardiac Enzymes Recent Labs  Lab 05/11/18 1730  TROPONINI 0.39*   ------------------------------------------------------------------------------------------------------------------ Invalid input(s): POCBNP  ---------------------------------------------------------------------------------------------------------------  Urinalysis No results found for: COLORURINE, APPEARANCEUR, LABSPEC, PHURINE, GLUCOSEU, HGBUR, BILIRUBINUR, KETONESUR,  PROTEINUR, UROBILINOGEN, NITRITE, LEUKOCYTESUR   RADIOLOGY: Dg Chest Port 1 View  Result Date:  05/11/2018 CLINICAL DATA:  Pt complains of heart racing and CP, states HR 150s at home. CP described as tight hx of a-fib EXAM: PORTABLE CHEST - 1 VIEW COMPARISON:  none FINDINGS: No focal infiltrate. Coarse interstitial markings at the lung bases. No overt edema. Monitoring leads overlie the left hemithorax. Heart size normal.  Aortic Atherosclerosis (ICD10-170.0). No pneumothorax.  No definite effusion. Visualized bones unremarkable. IMPRESSION: No acute cardiopulmonary disease. Electronically Signed   By: Corlis Leak M.D.   On: 05/11/2018 17:54    EKG: Orders placed or performed during the hospital encounter of 05/11/18  . ED EKG  . ED EKG  . EKG 12-Lead  . EKG 12-Lead    IMPRESSION AND PLAN:  *Wide complex tachycardia Cardioverted by ER physician. Vitals are stable now. Monitor on telemetry. Cardiologist Dr. Lady Gary to see tomorrow. TSH is slightly elevated, check free T3 and free T4. Get echocardiogram. Patient had ablation done for atrial fibrillation at [facility] last year. May be a candidate for antiarrhythmic drugs with pacemaker?  *Elevated troponin Likely due to episodes of cardiac arrhythmia and cardioversion Continue to monitor serial troponin. No need for anticoagulation at this time unless troponin rises significantly.  *Paroxysmal atrial fibrillation Patient took sotalol and her as needed Cardizem today. Currently running bradycardiac in 50s I will hold sotalol for tonight.  Medications need to be readdressed tomorrow by cardiologist. Continue Eliquis.  *Hypothyroidism Levothyroxine to be continued at home dose, we may need to increase if needed.  TSH slightly high.  *Acute renal failure We will give IV fluids and monitor.  All the records are reviewed and case discussed with ED provider. Management plans discussed with the patient, family and they are in agreement.  CODE STATUS: Full code.  Patient's husband and daughter are present in the room during my  visit.  TOTAL TIME TAKING CARE OF THIS PATIENT: 50 minutes.    Altamese Dilling M.D on 05/11/2018   Between 7am to 6pm - Pager - [phone]  After 6pm go to [email or web address] - password [email or web address]  [facility]  Office  [phone]  CC: Primary care physician; Barbette Reichmann, MD   Note: This dictation was prepared with Dragon dictation along with smaller phrase technology. Any transcriptional errors that result from this process are unintentional.

## 2018-05-11 NOTE — ED Notes (Signed)
ED TO INPATIENT HANDOFF REPORT  ED Nurse Name and Phone #: Toma Copier [phone]  Name/Age/Gender Sarah Herring 78 y.o. female Room/Bed: ED15A/ED15A  Code Status   Code Status: Not on file  Home/SNF/Other Home Patient oriented to: self, place, time and situation Is this baseline? Yes   Triage Complete: Triage complete  Chief Complaint elevated heart, SOB, chest discomfort  Triage Note Pt c/o heart racing and CP, states HR 150s at home. CP described as tight, hx of a-fib , HR 157 in traige . Pt speaking in full sentences   Allergies Allergies  Allergen Reactions  . Sulfur Itching and Swelling    Level of Care/Admitting Diagnosis ED Disposition    ED Disposition Condition Comment   Admit  Hospital Area: [facility] [[id number]]  Level of Care: Telemetry [5]  Diagnosis: Ventricular arrhythmia [[id number]]  Admitting Physician: Altamese Dilling [[id number]]  Attending Physician: Altamese Dilling [id number]  Estimated length of stay: past midnight tomorrow  Certification:: I certify this patient will need inpatient services for at least 2 midnights  PT Class (Do Not Modify): Inpatient [101]  PT Acc Code (Do Not Modify): Private [1]       Medical/Surgery History Past Medical History:  Diagnosis Date  . Asthma   . Atrial fibrillation (HCC)   . COPD (chronic obstructive pulmonary disease) (HCC)   . Dyspnea   . GERD (gastroesophageal reflux disease)   . Glaucoma   . Hypercholesteremia   . Hypertension   . Hypothyroidism   . Osteoporosis    Past Surgical History:  Procedure Laterality Date  . ABLATION    . CARDIOVERSION N/A 05/21/2016   Procedure: CARDIOVERSION;  Surgeon: Lamar Blinks, MD;  Location: [facility] ORS;  Service: Cardiovascular;  Laterality: N/A;  . COLONOSCOPY    . KYPHOPLASTY N/A 12/30/2017   Procedure: [id number];  Surgeon: Kennedy Bucker, MD;  Location: [facility] ORS;  Service: Orthopedics;  Laterality: N/A;     IV  Location/Drains/Wounds Patient Lines/Drains/Airways Status   Active Line/Drains/Airways    Name:   Placement date:   Placement time:   Site:   Days:   Peripheral IV 05/11/18 Left Antecubital   05/11/18    1730    Antecubital   less than 1   Peripheral IV 05/11/18 Right Antecubital   05/11/18    1751    Antecubital   less than 1   Incision (Closed) 12/30/17 Back   12/30/17    1335     132          Intake/Output Last 24 hours  Intake/Output Summary (Last 24 hours) at 05/11/2018 2135 Last data filed at 05/11/2018 1818 Gross per 24 hour  Intake 45.53 ml  Output -  Net 45.53 ml    Labs/Imaging Results for orders placed or performed during the hospital encounter of 05/11/18 (from the past 48 hour(s))  Basic metabolic panel     Status: Abnormal   Collection Time: 05/11/18  5:30 PM  Result Value Ref Range   Sodium 132 (L) 135 - 145 mmol/L   Potassium 4.1 3.5 - 5.1 mmol/L   Chloride 98 98 - 111 mmol/L   CO2 24 22 - 32 mmol/L   Glucose, Bld 100 (H) 70 - 99 mg/dL   BUN 19 8 - 23 mg/dL   Creatinine, Ser 0.62 (H) 0.44 - 1.00 mg/dL   Calcium 9.0 8.9 - 69.4 mg/dL   GFR calc non Af Amer 47 (L) >60 mL/min   GFR calc Af Amer 55 (  L) >60 mL/min   Anion gap 10 5 - 15    Comment: Performed at [facility], [address] [place], [place] [address]  CBC     Status: Abnormal   Collection Time: 05/11/18  5:30 PM  Result Value Ref Range   WBC 11.6 (H) 4.0 - 10.5 K/uL   RBC 4.69 3.87 - 5.11 MIL/uL   Hemoglobin 14.3 12.0 - 15.0 g/dL   HCT 46.8 03.2 - 12.2 %   MCV 92.5 80.0 - 100.0 fL   MCH 30.5 26.0 - 34.0 pg   MCHC 32.9 30.0 - 36.0 g/dL   RDW 48.2 50.0 - 37.0 %   Platelets 335 150 - 400 K/uL   nRBC 0.0 0.0 - 0.2 %    Comment: Performed at [facility], [address] [place], [place] [address]  Troponin I - ONCE - STAT     Status: Abnormal   Collection Time: 05/11/18  5:30 PM  Result Value Ref Range   Troponin I 0.39 (HH) <0.03 ng/mL    Comment: CRITICAL RESULT  CALLED TO, READ BACK BY AND VERIFIED WITH JESSICA COLETRANE 05/11/18 @ 1801  MLK Performed at [facility], [address] [place], [place] [address]   TSH     Status: Abnormal   Collection Time: 05/11/18  5:30 PM  Result Value Ref Range   TSH 7.814 (H) 0.350 - 4.500 uIU/mL    Comment: Performed by a 3rd Generation assay with a functional sensitivity of <=0.01 uIU/mL. Performed at [facility], [address] [place], [place] [address]   Magnesium     Status: None   Collection Time: 05/11/18  5:30 PM  Result Value Ref Range   Magnesium 2.0 1.7 - 2.4 mg/dL    Comment: Performed at [facility], [address] [place], [place] [address]  Brain natriuretic peptide     Status: Abnormal   Collection Time: 05/11/18  5:30 PM  Result Value Ref Range   B Natriuretic Peptide 916.0 (H) 0.0 - 100.0 pg/mL    Comment: Performed at [facility], [address] [place], [place] [address]   Dg Chest Port 1 View  Result Date: 05/11/2018 CLINICAL DATA:  Pt complains of heart racing and CP, states HR 150s at home. CP described as tight hx of a-fib EXAM: PORTABLE CHEST - 1 VIEW COMPARISON:  none FINDINGS: No focal infiltrate. Coarse interstitial markings at the lung bases. No overt edema. Monitoring leads overlie the left hemithorax. Heart size normal.  Aortic Atherosclerosis (ICD10-170.0). No pneumothorax.  No definite effusion. Visualized bones unremarkable. IMPRESSION: No acute cardiopulmonary disease. Electronically Signed   By: Corlis Leak M.D.   On: 05/11/2018 17:54    Pending Labs Unresulted Labs (From admission, onward)    Start     Ordered   05/11/18 2119  Troponin I - Now Then Q6H  Now then every 6 hours,   STAT     05/11/18 2118   Signed and Held  Basic metabolic panel  Tomorrow morning,   R     Signed and Held   Signed and Held  CBC  Tomorrow morning,   R     Signed and Held          Vitals/Pain Today's Vitals   05/11/18 2015 05/11/18 2016  05/11/18 2027 05/11/18 2032  BP:    (!) 98/49  Pulse: (!) 55 (!) 102 (!) 118 (!) 53  Resp: 16 18 20  19  Temp:      TempSrc:      SpO2: 100% (!) 83% 100% 99%  PainSc:        Isolation Precautions No active isolations  Medications Medications  adenosine (ADENOCARD) 6 MG/2ML injection (has no administration in time range)  adenosine (ADENOCARD) 12 MG/4ML injection (has no administration in time range)  sodium chloride flush (NS) 0.9 % injection 3 mL (3 mLs Intravenous Given 05/11/18 1943)  magnesium sulfate IVPB 2 g 50 mL ( Intravenous Stopped 05/11/18 1817)  etomidate (AMIDATE) injection 10 mg (10 mg Intravenous Given 05/11/18 1927)  0.9 %  sodium chloride infusion ( Intravenous Rate/Dose Change 05/11/18 2122)    Mobility walks Low fall risk   Focused Assessments Cardiac Assessment Handoff:  Cardiac Rhythm: Atrial flutter Lab Results  Component Value Date   TROPONINI 0.39 (HH) 05/11/2018   No results found for: DDIMER Does the Patient currently have chest pain? No     Recommendations: See Admitting Provider Note  Report given to:   Additional Notes: pt sinus brady

## 2018-05-11 NOTE — ED Notes (Signed)
Blood drawn and sent to the lab.

## 2018-05-11 NOTE — ED Notes (Signed)
Pt is alert and oriented. Denies pain. Fluids increased due to hypotension. Admitting MD has seen pt.

## 2018-05-11 NOTE — ED Provider Notes (Addendum)
[facility] Emergency Department Provider Note  ____________________________________________   I have reviewed the triage vital signs and the nursing notes. Where available I have reviewed prior notes and, if possible and indicated, outside hospital notes.    HISTORY  Chief Complaint Chest Pain    HPI Sarah FilbertCarol S Herring is a 78 y.o. female  Presents today complaining of rapid heart rate.  She is not actually having chest pain, she feels slightly short of breath with it.  She has no pain with it.  She has a history of PAF, she is on Eliquis for that.  He takes sotalol, she has diltiazem to take for breakthrough tachycardia which she has taken x2 over the last 2 days without any relief.  Sometimes she can just cough and this goes away sometimes she can Valsalva and it goes away, this time it is persistent.  It is been going on since at least Monday she thinks.  She is compliant with all of her medications.  Nothing else makes it better or worse no other alleviating or aggravating symptoms she called cardiology and they asked her to come in    Past Medical History:  Diagnosis Date  . Asthma   . Atrial fibrillation (HCC)   . COPD (chronic obstructive pulmonary disease) (HCC)   . Dyspnea   . GERD (gastroesophageal reflux disease)   . Glaucoma   . Hypercholesteremia   . Hypertension   . Hypothyroidism   . Osteoporosis     There are no active problems to display for this patient.   Past Surgical History:  Procedure Laterality Date  . ABLATION    . CARDIOVERSION N/A 05/21/2016   Procedure: CARDIOVERSION;  Surgeon: Lamar BlinksBruce J Kowalski, MD;  Location: [facility] ORS;  Service: Cardiovascular;  Laterality: N/A;  . COLONOSCOPY    . KYPHOPLASTY N/A 12/30/2017   Procedure: [id number];  Surgeon: Kennedy BuckerMenz, Michael, MD;  Location: [facility] ORS;  Service: Orthopedics;  Laterality: N/A;    Prior to Admission medications   Medication Sig Start Date End Date Taking? Authorizing  Provider  acetaminophen (TYLENOL) 500 MG tablet Take 1,000 mg by mouth every 6 (six) hours as needed for moderate pain or headache.     [provider]  apixaban (ELIQUIS) 5 MG TABS tablet Take 5 mg by mouth 2 (two) times daily.    [provider]  atorvastatin (LIPITOR) 20 MG tablet Take 20 mg by mouth daily.    [provider]  Calcium Carb-Cholecalciferol (CALCIUM 600/VITAMIN D3 PO) Take 1 tablet by mouth daily.    [provider]  Cholecalciferol (VITAMIN D) 2000 units CAPS Take 4,000 Units by mouth daily.     [provider]  diltiazem (CARDIZEM CD) 180 MG 24 hr capsule Take 180 mg by mouth daily. 12/08/17   [provider]  furosemide (LASIX) 40 MG tablet Take 40 mg by mouth daily.    [provider]  HYDROcodone-acetaminophen (NORCO) 5-325 MG tablet Take 1 tablet by mouth every 6 (six) hours as needed for moderate pain. 12/30/17   Kennedy BuckerMenz, Michael, MD  latanoprost (XALATAN) 0.005 % ophthalmic solution Place 1 drop into both eyes at bedtime.    [provider]  levothyroxine (SYNTHROID, LEVOTHROID) 112 MCG tablet Take 112 mcg by mouth daily before breakfast.    [provider]  magnesium oxide (MAG-OX) 400 MG tablet Take 400 mg by mouth 2 (two) times daily.     [provider]  Multiple Vitamin (MULTIVITAMIN WITH MINERALS) TABS  tablet Take 1 tablet by mouth daily.    [provider]  omeprazole (PRILOSEC) 20 MG capsule Take 20 mg by mouth daily.    [provider]  Polyvinyl Alcohol-Povidone PF (REFRESH) 1.4-0.6 % SOLN Place 1 drop into both eyes 2 (two) times daily as needed (for dry eyes).    [provider]  sotalol (BETAPACE) 80 MG tablet Take 80 mg by mouth 2 (two) times daily.    [provider]    Allergies Sulfur  No family history on file.  Social History Social History   Tobacco Use  . Smoking status: Never Smoker  Substance Use Topics  . Alcohol use:  Not on file  . Drug use: No    Review of Systems Constitutional: No fever/chills Eyes: No visual changes. ENT: No sore throat. No stiff neck no neck pain Cardiovascular: Denies chest pain. Respiratory: Denies shortness of breath. Gastrointestinal:   no vomiting.  No diarrhea.  No constipation. Genitourinary: Negative for dysuria. Musculoskeletal: Negative lower extremity swelling Skin: Negative for rash. Neurological: Negative for severe headaches, focal weakness or numbness.   ____________________________________________   PHYSICAL EXAM:  VITAL SIGNS: ED Triage Vitals  Enc Vitals Group     BP 05/11/18 1721 101/63     Pulse Rate 05/11/18 1712 (!) 157     Resp 05/11/18 1712 16     Temp 05/11/18 1712 98 F (36.7 C)     Temp Source 05/11/18 1712 Oral     SpO2 05/11/18 1712 99 %     Weight --      Height --      Head Circumference --      Peak Flow --      Pain Score 05/11/18 1710 3     Pain Loc --      Pain Edu? --      Excl. in GC? --     Constitutional: Alert and oriented. Well appearing and in no acute distress. Eyes: Conjunctivae are normal Head: Atraumatic HEENT: No congestion/rhinnorhea. Mucous membranes are moist.  Oropharynx non-erythematous Neck:   Nontender with no meningismus, no masses, no stridor Cardiovascular: Cardiac, regular rhythm. Grossly normal heart sounds.  Good peripheral circulation. Respiratory: Normal respiratory effort.  No retractions. Lungs CTAB. Abdominal: Soft and nontender. No distention. No guarding no rebound Back:  There is no focal tenderness or step off.  there is no midline tenderness there are no lesions noted. there is no CVA tenderness  Musculoskeletal: No lower extremity tenderness, no upper extremity tenderness. No joint effusions, no DVT signs strong distal pulses mild lower extremity bilateral symmetric edema Neurologic:  Normal speech and language. No gross focal neurologic deficits are appreciated.  Skin:  Skin is warm,  dry and intact. No rash noted. Psychiatric: Mood and affect are normal. Speech and behavior are normal.  ____________________________________________   LABS (all labs ordered are listed, but only abnormal results are displayed)  Labs Reviewed  BASIC METABOLIC PANEL  CBC  TROPONIN I    Pertinent labs  results that were available during my care of the patient were reviewed by me and considered in my medical decision making (see chart for details). ____________________________________________  EKG  I personally interpreted any EKGs ordered by me or triage Tachycardia, a wide QRS rate 157, no acute ischemic changes, LAD noted, when compared to old EKGs, numerous are present, many different configurations are noted, patient does have a history of a right bundle branch block when she was in A. fib  in the past but the configuration was somewhat different from this. ____________________________________________  RADIOLOGY  Pertinent labs & imaging results that were available during my care of the patient were reviewed by me and considered in my medical decision making (see chart for details). If possible, patient and/or family made aware of any abnormal findings.  No results found. ____________________________________________    PROCEDURES  Procedure(s) performed: None  .Sedation Date/Time: 05/11/2018 6:58 PM Performed by: Jeanmarie Plant, MD Authorized by: Jeanmarie Plant, MD   Consent:    Consent obtained:  Written (electronic informed consent)   Risks discussed:  Allergic reaction, dysrhythmia, inadequate sedation, nausea, vomiting, respiratory compromise necessitating ventilatory assistance and intubation, prolonged sedation necessitating reversal and prolonged hypoxia resulting in organ damage   Alternatives discussed:  Analgesia without sedation and anxiolysis Universal protocol:    Procedure explained and questions answered to patient or proxy's satisfaction: yes      Relevant documents present and verified: yes     Test results available and properly labeled: yes     Imaging studies available: yes     Required blood products, implants, devices, and special equipment available: yes     Immediately prior to procedure a time out was called: yes     Patient identity confirmation method:  Arm band Indications:    Procedure performed:  Cardioversion Pre-sedation assessment:    Time since last food or drink:  4 hours   NPO status caution: urgency dictates proceeding with non-ideal NPO status     ASA classification: class 3 - patient with severe systemic disease     Neck mobility: normal     Mouth opening:  2 finger widths   Thyromental distance:  4 finger widths   Mallampati score:  II - soft palate, uvula, fauces visible   Pre-sedation assessments completed and reviewed: airway patency, cardiovascular function, hydration status, mental status, nausea/vomiting, pain level, respiratory function and temperature     Pre-sedation assessment completed:  05/11/2018 6:59 PM Immediate pre-procedure details:    Reassessment: Patient reassessed immediately prior to procedure     Reviewed: vital signs, relevant labs/tests and NPO status     Verified: bag valve mask available, emergency equipment available, intubation equipment available, IV patency confirmed, oxygen available, reversal medications available and suction available   Procedure details (see MAR for exact dosages):    Preoxygenation:  Nasal cannula   Sedation:  Etomidate   Intra-procedure monitoring:  Blood pressure monitoring, continuous pulse oximetry, cardiac monitor, frequent vital sign checks and frequent LOC assessments   Intra-procedure events: none     Intra-procedure management:  Fluid bolus   Total Provider sedation time (minutes):  15 Post-procedure details:    Post-sedation assessment completed:  05/11/2018 7:44 PM   Attendance: Constant attendance by certified staff until patient recovered      Recovery: Patient returned to pre-procedure baseline     Post-sedation assessments completed and reviewed: airway patency, cardiovascular function, hydration status, mental status and respiratory function     Patient is stable for discharge or admission: yes     Patient tolerance:  Tolerated well, no immediate complications  .Cardioversion Date/Time: 05/11/2018 7:45 PM Performed by: Jeanmarie Plant, MD Authorized by: Jeanmarie Plant, MD   Consent:    Consent obtained:  Verbal and written   Consent given by:  Patient   Risks discussed:  Cutaneous burn, death, induced arrhythmia and pain   Alternatives discussed:  No treatment, rate-control medication, alternative treatment, referral, delayed treatment and  observation Pre-procedure details:    Cardioversion basis:  Emergent   Rhythm:  Atrial flutter   Electrode placement:  Anterior-posterior Patient sedated: Yes. Refer to sedation procedure documentation for details of sedation.  Attempt one:    Cardioversion mode:  Synchronous   Waveform:  Biphasic   Shock (Joules):  100   Shock outcome:  Conversion to normal sinus rhythm Post-procedure details:    Patient status:  Awake   Patient tolerance of procedure:  Tolerated well, no immediate complications    Critical Care performed: CRITICAL CARE Performed by: Jeanmarie Plant   Total critical care time: 45 minutes  Critical care time was exclusive of separately billable procedures and treating other patients.  Critical care was necessary to treat or prevent imminent or life-threatening deterioration.  Critical care was time spent personally by me on the following activities: development of treatment plan with patient and/or surrogate as well as nursing, discussions with consultants, evaluation of patient's response to treatment, examination of patient, obtaining history from patient or surrogate, ordering and performing treatments and interventions, ordering and review of  laboratory studies, ordering and review of radiographic studies, pulse oximetry and re-evaluation of patient's condition.   ____________________________________________   INITIAL IMPRESSION / ASSESSMENT AND PLAN / ED COURSE  Pertinent labs & imaging results that were available during my care of the patient were reviewed by me and considered in my medical decision making (see chart for details).  Patient is quite stable at this time with a tachydysrhythmia, rate is around 155 does not vary very much.  This would be considered probably a flutter with aberrancy most likely;  V. tach it certainly possible atrial fib is certainly possible, I do not think SVT is likely.  She is anticoagulated, this has been going on for several days and she is having no acute pain and she is stable in the room at this moment.  We have hooked her up to pads, we have established an IV.  I will talk to cardiology about what approach they would like to take with this patient.  She was sent directly in by her cardiologist for further assessment and we will talk to them prior to initiating treatment as she appears to be stable.  ----------------------------------------- 6:22 PM on 05/11/2018 ----------------------------------------- I d/w dr. Lady Gary, for cards. We talked about ecg, ddx physical findings and h/p. He agrees w/ mag and then suggests either cardizem, with the understanding of the qrs, or cardioversion. I have suggested both to patient. She would like to see if the mag does anything. Still no cp no sob and no sx x awareness of rapid beat.    ----------------------------------------- 6:45 PM on 05/11/2018 -----------------------------------------  Patient remains asymptomatic, she still has atrial flutter most likely at least a tachydysrhythmia at 153 however, there is no indication that the magnesium is anything pressures are slightly low, low 100s, I talked to the patient about the risk benefits alternatives of  cardioversion versus diltiazem, as well as the risk benefits and alternatives of moderate sedation, patient has had last p.o. was a bowl of cereal 5 hours ago and she feels that she would prefer to have cardioversion.  I think she likely still need to be admitted as her troponin is elevated this is likely demand ischemia.  We therefore will set the patient up for a synchronized cardioversion and we will reassess.  Again patient understands all the risk benefits and alternatives.  ----------------------------------------- 7:47 PM on 05/11/2018 -----------------------------------------  Patient cardioverted to  normal sinus rhythm, repeat EKGs, 2, first is sinus rhythm, rate 70, normal axis, nonspecific ST changes, second shows normal sinus rhythm at rate 57 with bradycardia noted, there are no acute ischemic changes and shows of ST elevation or depression but there are significant drop beats.  We will continue to monitor  ----------------------------------------- 8:02 PM on 05/11/2018 -----------------------------------------  Called Dr. Lady GaryFath again, appreciate consult, patient is in a sinus bradycardia, sometimes as low as the mid to upper 30s, usually in the 50s, she did take extra diltiazem shortly before coming in here at 1030 this morning which she is not used to, this certainly could be causing it, Dr. Lady Garyfath feels that she is safe for a floor bed at this time pressures are good and she is asymptomatic.  We are admitting her to the hospitalist service        ____________________________________________   FINAL CLINICAL IMPRESSION(S) / ED DIAGNOSES  Final diagnoses:  None      This chart was dictated using voice recognition software.  Despite best efforts to proofread,  errors can occur which can change meaning.      Jeanmarie PlantMcShane, Sevin Farone A, MD 05/11/18 1734    Jeanmarie PlantMcShane, Rashan Patient A, MD 05/11/18 1825    Jeanmarie PlantMcShane, Sandhya Denherder A, MD 05/11/18 1946    Jeanmarie PlantMcShane, Labresha Mellor A, MD 05/11/18 1947     Jeanmarie PlantMcShane, Euell Schiff A, MD 05/11/18 1950    Jeanmarie PlantMcShane, Janashia Parco A, MD 05/11/18 2003

## 2018-05-12 ENCOUNTER — Inpatient Hospital Stay
Admit: 2018-05-12 | Discharge: 2018-05-12 | Disposition: A | Discharge status: Home / Self Care | Payer: Medicare Other | Attending: Internal Medicine | Admitting: Internal Medicine

## 2018-05-12 DIAGNOSIS — R000 Tachycardia, unspecified: Secondary | ICD-10-CM | POA: Diagnosis not present

## 2018-05-12 LAB — CBC
HCT: 35.9 % — ABNORMAL LOW (ref 36.0–46.0)
Hemoglobin: 11.7 g/dL — ABNORMAL LOW (ref 12.0–15.0)
MCH: 30.8 pg (ref 26.0–34.0)
MCHC: 32.6 g/dL (ref 30.0–36.0)
MCV: 94.5 fL (ref 80.0–100.0)
Platelets: 244 10*3/uL (ref 150–400)
RBC: 3.8 MIL/uL — ABNORMAL LOW (ref 3.87–5.11)
RDW: 12.6 % (ref 11.5–15.5)
WBC: 8.8 10*3/uL (ref 4.0–10.5)
nRBC: 0 % (ref 0.0–0.2)

## 2018-05-12 LAB — BASIC METABOLIC PANEL
Anion gap: 7 (ref 5–15)
BUN: 17 mg/dL (ref 8–23)
CHLORIDE: 103 mmol/L (ref 98–111)
CO2: 24 mmol/L (ref 22–32)
CREATININE: 1.01 mg/dL — AB (ref 0.44–1.00)
Calcium: 8.1 mg/dL — ABNORMAL LOW (ref 8.9–10.3)
GFR calc Af Amer: >60 mL/min (ref >60)
GFR calc non Af Amer: 53 mL/min — ABNORMAL LOW (ref >60)
Glucose, Bld: 97 mg/dL (ref 70–99)
Potassium: 3.6 mmol/L (ref 3.5–5.1)
Sodium: 134 mmol/L — ABNORMAL LOW (ref 135–145)

## 2018-05-12 LAB — ECHOCARDIOGRAM COMPLETE
Height: 63 in
Weight: 3009.54 oz

## 2018-05-12 LAB — TROPONIN I
Troponin I: 0.51 ng/mL (ref <0.03)
Troponin I: 0.4 ng/mL (ref <0.03)

## 2018-05-12 MED ORDER — FLECAINIDE ACETATE 50 MG PO TABS
50.0000 mg | ORAL_TABLET | Freq: Two times a day (BID) | ORAL | Status: DC
  Filled 2018-05-12: qty 1

## 2018-05-12 MED ORDER — DILTIAZEM HCL ER COATED BEADS 120 MG PO CP24
120.0000 mg | ORAL_CAPSULE | Freq: Every day | ORAL | Status: DC
  Administered 2018-05-12: 120 mg via ORAL
  Filled 2018-05-12: qty 1

## 2018-05-12 MED ORDER — DILTIAZEM HCL ER COATED BEADS 120 MG PO CP24: 120.0000 mg | ORAL_CAPSULE | Freq: Every day | ORAL | 0 refills | Status: DC

## 2018-05-12 NOTE — Discharge Summary (Signed)
[facility] [place] [facility]   PATIENT NAME: Sarah HemanCarol Tsan    MR#:  [id number]  DATE OF BIRTH:  03/11/1941  DATE OF ADMISSION:  05/11/2018 ADMITTING PHYSICIAN: Altamese DillingVaibhavkumar Vachhani, MD  DATE OF DISCHARGE: 05/12/2018 12:26 PM  PRIMARY CARE PHYSICIAN: Barbette ReichmannHande, Vishwanath, MD   ADMISSION DIAGNOSIS:  Tachycardia [R00.0] Wide-complex tachycardia (HCC) [I47.2] History of cardioversion [Z98.890]  DISCHARGE DIAGNOSIS:  Principal Problem:   Atrial fibrillation with RVR (HCC) Active Problems:   Ventricular arrhythmia Emphysema GERD  SECONDARY DIAGNOSIS:   Past Medical History:  Diagnosis Date  . Asthma   . Atrial fibrillation (HCC)   . COPD (chronic obstructive pulmonary disease) (HCC)   . Dyspnea   . GERD (gastroesophageal reflux disease)   . Glaucoma   . Hypercholesteremia   . Hypertension   . Hypothyroidism   . Osteoporosis      ADMITTING HISTORY Sarah Herring  is a 78 y.o. female with a known history of asthma, atrial fibrillation, COPD, dyspnea, gastroesophageal reflux disease, hypercholesterolemia, hypertension, hypothyroidism, osteoporosis-is compliant to her medications as advised.  She had [facility] and since then she is also on sotalol.  She was advised to take Cardizem as needed for her episodes of tachycardia and palpitation. For last 1 to 2 days she is feeling palpitation again and she took Cardizem yesterday and today.  Still persisted to have tachycardia so decided to come to emergency room. \She was noted to have tachycardia with wide QRS with heart rate of 150. ER physician discussed with cardiologist Dr. Fritz PickerelFath-he suggested to cardiovert the patient and admit for monitoring.After cardioversion patient converted to sinus rhythm but slightly on the bradycardia side ranging from 40-55, asymptomatic, blood pressure stable.  HOSPITAL COURSE:  Patient was admitted to telemetry.  Patient was cardioverted and  converted to sinus rhythm.  She was seen by cardiology and recommended oral Cardizem 120 mg daily.  Patient advised to discontinue oral sotalol.  She will start oral flecainide from morning tomorrow after seeing the cardiology in the clinic.  Patient will continue Eliquis for anticoagulation.  As per cardiology patient will be discharged home.  CONSULTS OBTAINED:  Treatment Team:  Marcina MillardParaschos, Alexander, MD  DRUG ALLERGIES:   Allergies  Allergen Reactions  . Sulfur Itching and Swelling    DISCHARGE MEDICATIONS:   Allergies as of 05/12/2018      Reactions   Sulfur Itching, Swelling      Medication List    STOP taking these medications   HYDROcodone-acetaminophen 5-325 MG tablet Commonly known as:  NORCO   sotalol 80 MG tablet Commonly known as:  BETAPACE     TAKE these medications   acetaminophen 500 MG tablet Commonly known as:  TYLENOL Take 1,000 mg by mouth every 6 (six) hours as needed for moderate pain or headache.   atorvastatin 20 MG tablet Commonly known as:  LIPITOR Take 20 mg by mouth daily.   CALCIUM 600/VITAMIN D3 PO Take 1 tablet by mouth daily.   diltiazem 120 MG 24 hr capsule Commonly known as:  CARDIZEM CD Take 1 capsule (120 mg total) by mouth daily for 30 days. What changed:    medication strength  how much to take   ELIQUIS 5 MG Tabs tablet Generic drug:  apixaban Take 5 mg by mouth 2 (two) times daily.   furosemide 40 MG tablet Commonly known as:  LASIX Take 40 mg by mouth daily.   latanoprost 0.005 % ophthalmic solution  Commonly known as:  XALATAN Place 1 drop into both eyes at bedtime.   levothyroxine 112 MCG tablet Commonly known as:  SYNTHROID, LEVOTHROID Take 112 mcg by mouth daily before breakfast.   magnesium oxide 400 MG tablet Commonly known as:  MAG-OX Take 400 mg by mouth 2 (two) times daily.   multivitamin with minerals Tabs tablet Take 1 tablet by mouth daily.   omeprazole 20 MG capsule Commonly known as:   PRILOSEC Take 20 mg by mouth daily.   REFRESH 1.4-0.6 % Soln Generic drug:  Polyvinyl Alcohol-Povidone PF Place 1 drop into both eyes 2 (two) times daily as needed (for dry eyes).   Vitamin D 50 MCG (2000 UT) Caps Take 4,000 Units by mouth daily.       Today  Patient seen today In sinus rhythm No chest pain No palpitations Hemodynamically stable VITAL SIGNS:  Blood pressure (!) 102/59, pulse 65, temperature 97.6 F (36.4 C), temperature source Oral, resp. rate 15, height 5\' 3"  (1.6 m), weight 85.3 kg, SpO2 98 %.  I/O:    Intake/Output Summary (Last 24 hours) at 05/12/2018 1446 Last data filed at 05/12/2018 0700 Gross per 24 hour  Intake 625.87 ml  Output 550 ml  Net 75.87 ml    PHYSICAL EXAMINATION:  Physical Exam  GENERAL:  78 y.o.-year-old patient lying in the bed with no acute distress.  LUNGS: Normal breath sounds bilaterally, no wheezing, rales,rhonchi or crepitation. No use of accessory muscles of respiration.  CARDIOVASCULAR: S1, S2 normal. No murmurs, rubs, or gallops.  ABDOMEN: Soft, non-tender, non-distended. Bowel sounds present. No organomegaly or mass.  NEUROLOGIC: Moves all 4 extremities. PSYCHIATRIC: The patient is alert and oriented x 3.  SKIN: No obvious rash, lesion, or ulcer.   DATA REVIEW:   CBC Recent Labs  Lab 05/12/18 0319  WBC 8.8  HGB 11.7*  HCT 35.9*  PLT 244    Chemistries  Recent Labs  Lab 05/11/18 1730 05/12/18 0319  NA 132* 134*  K 4.1 3.6  CL 98 103  CO2 24 24  GLUCOSE 100* 97  BUN 19 17  CREATININE 1.11* 1.01*  CALCIUM 9.0 8.1*  MG 2.0  --     Cardiac Enzymes Recent Labs  Lab 05/12/18 0918  TROPONINI 0.40*    Microbiology Results  Results for orders placed or performed during the hospital encounter of 05/02/18  C difficile quick scan w PCR reflex     Status: None   Collection Time: 04/30/18  2:30 PM  Result Value Ref Range Status   C Diff antigen NEGATIVE NEGATIVE Final   C Diff toxin NEGATIVE NEGATIVE  Final   C Diff interpretation No C. difficile detected.  Final    Comment: Performed at [facility], [address] [place], [place] [address]  Gastrointestinal Panel by PCR , Stool     Status: None   Collection Time: 04/30/18  2:30 PM  Result Value Ref Range Status   Campylobacter species NOT DETECTED NOT DETECTED Final   Plesimonas shigelloides NOT DETECTED NOT DETECTED Final   Salmonella species NOT DETECTED NOT DETECTED Final   Yersinia enterocolitica NOT DETECTED NOT DETECTED Final   Vibrio species NOT DETECTED NOT DETECTED Final   Vibrio cholerae NOT DETECTED NOT DETECTED Final   Enteroaggregative E coli (EAEC) NOT DETECTED NOT DETECTED Final   Enteropathogenic E coli (EPEC) NOT DETECTED NOT DETECTED Final   Enterotoxigenic E coli (ETEC) NOT DETECTED NOT DETECTED Final   Shiga like toxin producing E  coli (STEC) NOT DETECTED NOT DETECTED Final   Shigella/Enteroinvasive E coli (EIEC) NOT DETECTED NOT DETECTED Final   Cryptosporidium NOT DETECTED NOT DETECTED Final   Cyclospora cayetanensis NOT DETECTED NOT DETECTED Final   Entamoeba histolytica NOT DETECTED NOT DETECTED Final   Giardia lamblia NOT DETECTED NOT DETECTED Final   Adenovirus F40/41 NOT DETECTED NOT DETECTED Final   Astrovirus NOT DETECTED NOT DETECTED Final   Norovirus GI/GII NOT DETECTED NOT DETECTED Final   Rotavirus A NOT DETECTED NOT DETECTED Final   Sapovirus (I, II, IV, and V) NOT DETECTED NOT DETECTED Final    Comment: Performed at [facility], [address] [place], [place] [address]    RADIOLOGY:  Dg Chest Port 1 View  Result Date: 05/11/2018 CLINICAL DATA:  Pt complains of heart racing and CP, states HR 150s at home. CP described as tight hx of a-fib EXAM: PORTABLE CHEST - 1 VIEW COMPARISON:  none FINDINGS: No focal infiltrate. Coarse interstitial markings at the lung bases. No overt edema. Monitoring leads overlie the left hemithorax. Heart size normal.  Aortic Atherosclerosis  (ICD10-170.0). No pneumothorax.  No definite effusion. Visualized bones unremarkable. IMPRESSION: No acute cardiopulmonary disease. Electronically Signed   By: Corlis Leak M.D.   On: 05/11/2018 17:54    Follow up with PCP in 1 week.  Management plans discussed with the patient, family and they are in agreement.  CODE STATUS: Full code    Code Status Orders  (From admission, onward)         Start     Ordered   05/11/18 2219  Full code  Continuous     05/11/18 2218        Code Status History    This patient has a current code status but no historical code status.      TOTAL TIME TAKING CARE OF THIS PATIENT ON DAY OF DISCHARGE: more than 34 minutes.   Ihor Austin M.D on 05/12/2018 at 2:46 PM  Between 7am to 6pm - Pager - [phone]  After 6pm go to [email or web address] - password [email or web address] [facility]  [facility] [place] [facility]  Office  [phone]  CC: Primary care physician; Barbette Reichmann, MD  Note: This dictation was prepared with Dragon dictation along with smaller phrase technology. Any transcriptional errors that result from this process are unintentional.

## 2018-05-12 NOTE — Progress Notes (Signed)
Discussed patient with Dr. Gerre Pebbles [facility].  We decided to stop sotalol, start Cardizem CD 120 mg daily, discharge patient home today, follow-up in clinic tomorrow, will likely start flecainide 100 mg twice daily as outpatient starting 05/14/2018.

## 2018-05-12 NOTE — Plan of Care (Signed)
  Problem: Education: Goal: Knowledge of General Education information will improve Description Including pain rating scale, medication(s)/side effects and non-pharmacologic comfort measures Outcome: Progressing   Problem: Health Behavior/Discharge Planning: Goal: Ability to manage health-related needs will improve Outcome: Progressing   Problem: Clinical Measurements: Goal: Ability to maintain clinical measurements within normal limits will improve Outcome: Progressing   Problem: Activity: Goal: Risk for activity intolerance will decrease Outcome: Progressing   Problem: Education: Goal: Ability to manage disease process will improve Outcome: Progressing   Problem: Cardiac: Goal: Ability to achieve and maintain adequate cardiopulmonary perfusion will improve Outcome: Progressing

## 2018-05-12 NOTE — Care Management Note (Signed)
Case Management Note  Patient Details  Name: Sarah Herring MRN: [id number] Date of Birth: 1941-04-01  Subjective/Objective:  From home with husband.  DC today.  Presented Code 51.  Independent in all adls, denies issues accessing medical care, obtaining medications or with transportation.  Current with PCP.  No discharge needs identified at present by care manager or members of care team                    Action/Plan:   Expected Discharge Date:  05/12/18               Expected Discharge Plan:  Home/Self Care  In-House Referral:     Discharge planning Services  CM Consult  Post Acute Care Choice:    Choice offered to:     DME Arranged:    DME Agency:     HH Arranged:    HH Agency:     Status of Service:  Completed, signed off  If discussed at [employer] of Stay Meetings, dates discussed:    Additional Comments:  Sherren Kerns, RN 05/12/2018, 11:49 AM

## 2018-05-12 NOTE — Progress Notes (Signed)
Sarah Herring to be D/C'd Home per MD order.  Discussed prescriptions and follow up appointments with the patient. Prescriptions were e-prescribed, medication list explained in detail. Pt verbalized understanding.  Allergies as of 05/12/2018      Reactions   Sulfur Itching, Swelling      Medication List    STOP taking these medications   HYDROcodone-acetaminophen 5-325 MG tablet Commonly known as:  NORCO   sotalol 80 MG tablet Commonly known as:  BETAPACE     TAKE these medications   acetaminophen 500 MG tablet Commonly known as:  TYLENOL Take 1,000 mg by mouth every 6 (six) hours as needed for moderate pain or headache.   atorvastatin 20 MG tablet Commonly known as:  LIPITOR Take 20 mg by mouth daily.   CALCIUM 600/VITAMIN D3 PO Take 1 tablet by mouth daily.   diltiazem 120 MG 24 hr capsule Commonly known as:  CARDIZEM CD Take 1 capsule (120 mg total) by mouth daily for 30 days. What changed:    medication strength  how much to take   ELIQUIS 5 MG Tabs tablet Generic drug:  apixaban Take 5 mg by mouth 2 (two) times daily.   furosemide 40 MG tablet Commonly known as:  LASIX Take 40 mg by mouth daily.   latanoprost 0.005 % ophthalmic solution Commonly known as:  XALATAN Place 1 drop into both eyes at bedtime.   levothyroxine 112 MCG tablet Commonly known as:  SYNTHROID, LEVOTHROID Take 112 mcg by mouth daily before breakfast.   magnesium oxide 400 MG tablet Commonly known as:  MAG-OX Take 400 mg by mouth 2 (two) times daily.   multivitamin with minerals Tabs tablet Take 1 tablet by mouth daily.   omeprazole 20 MG capsule Commonly known as:  PRILOSEC Take 20 mg by mouth daily.   REFRESH 1.4-0.6 % Soln Generic drug:  Polyvinyl Alcohol-Povidone PF Place 1 drop into both eyes 2 (two) times daily as needed (for dry eyes).   Vitamin D 50 MCG (2000 UT) Caps Take 4,000 Units by mouth daily.       Vitals:   05/12/18 0836 05/12/18 1004  BP: 124/61  (!) 102/59  Pulse: 62 65  Resp: 15   Temp: 97.6 F (36.4 C)   SpO2: 98%     Tele box removed and returned. Skin clean, dry and intact without evidence of skin break down, no evidence of skin tears noted. IV catheter discontinued intact. Site without signs and symptoms of complications. Dressing and pressure applied. Pt denies pain at this time. No complaints noted.  An After Visit Summary was printed and given to the patient. Patient escorted via WC, and D/C home via private auto.  Rigoberto Noel

## 2018-05-12 NOTE — Care Management Important Message (Signed)
Important Message  Patient Details  Name: Sarah Herring MRN: [id number] Date of Birth: 01-Jun-1940   Medicare Important Message Given:  Yes    Sherren Kerns, RN 05/12/2018, 11:46 AM

## 2018-05-12 NOTE — Progress Notes (Signed)
Pt's bp is 102/59 HR 65. Per Dr. Tobi Bastos ok to give cardizem. Will continue to monitor.

## 2018-05-12 NOTE — Progress Notes (Signed)
*  PRELIMINARY RESULTS* Echocardiogram 2D Echocardiogram has been performed.  Cristela Blue 05/12/2018, 10:01 AM

## 2018-05-12 NOTE — Consult Note (Signed)
[facility]  CARDIOLOGY CONSULT NOTE  Patient ID: Sarah Herring MRN: [id number] DOB/AGE: 11-12-1940 78 y.o.  Admit date: 05/11/2018 Referring Physician [facility] Primary Physician [facility] Primary Cardiologist Gwen Pounds Reason for Consultation atrial fibrillation  HPI: 78 year old female referred for evaluation of atrial fibrillation.  Patient is status post catheter ablation 11/19/2017 at [facility].  She currently is on sotalol 80 mg twice daily with baseline bradycardia.  Resents with a 1 to 2-day history of intermittent palpitations typically treated with as needed Cardizem.  Came to [facility] emergency room where she was noted to be in atrial fibrillation with rapid ventricular rate 150 bpm.  Patient is on Eliquis for stroke prevention.  She underwent cardioversion with resultant sinus bradycardia with rates 40 to 55 bpm.  Sotalol was withheld.  She has remained in sinus rhythm currently with a heart rate of 66 bpm.  Review of systems complete and found to be negative unless listed above     Past Medical History:  Diagnosis Date  . Asthma   . Atrial fibrillation (HCC)   . COPD (chronic obstructive pulmonary disease) (HCC)   . Dyspnea   . GERD (gastroesophageal reflux disease)   . Glaucoma   . Hypercholesteremia   . Hypertension   . Hypothyroidism   . Osteoporosis     Past Surgical History:  Procedure Laterality Date  . ABLATION    . CARDIOVERSION N/A 05/21/2016   Procedure: CARDIOVERSION;  Surgeon: Lamar Blinks, MD;  Location: [facility] ORS;  Service: Cardiovascular;  Laterality: N/A;  . COLONOSCOPY    . KYPHOPLASTY N/A 12/30/2017   Procedure: [id number];  Surgeon: Kennedy Bucker, MD;  Location: [facility] ORS;  Service: Orthopedics;  Laterality: N/A;    Medications Prior to Admission  Medication Sig Dispense Refill Last Dose  . acetaminophen (TYLENOL) 500 MG tablet Take 1,000 mg by mouth every 6 (six) hours as needed for moderate pain or headache.    prn at prn  . apixaban (ELIQUIS) 5  MG TABS tablet Take 5 mg by mouth 2 (two) times daily.   05/11/2018 at 0800  . atorvastatin (LIPITOR) 20 MG tablet Take 20 mg by mouth daily.   05/10/2018 at 0800  . Calcium Carb-Cholecalciferol (CALCIUM 600/VITAMIN D3 PO) Take 1 tablet by mouth daily.   05/10/2018 at 0800  . Cholecalciferol (VITAMIN D) 2000 units CAPS Take 4,000 Units by mouth daily.    05/10/2018 at 0800  . diltiazem (CARDIZEM CD) 180 MG 24 hr capsule Take 180 mg by mouth daily.  11 05/11/2018 at 1000  . latanoprost (XALATAN) 0.005 % ophthalmic solution Place 1 drop into both eyes at bedtime.   05/10/2018 at 2100  . levothyroxine (SYNTHROID, LEVOTHROID) 112 MCG tablet Take 112 mcg by mouth daily before breakfast.   05/10/2018 at 0800  . magnesium oxide (MAG-OX) 400 MG tablet Take 400 mg by mouth 2 (two) times daily.    05/10/2018 at 0800  . Multiple Vitamin (MULTIVITAMIN WITH MINERALS) TABS tablet Take 1 tablet by mouth daily.   prn at prn  . omeprazole (PRILOSEC) 20 MG capsule Take 20 mg by mouth daily.   05/10/2018 at 0800  . Polyvinyl Alcohol-Povidone PF (REFRESH) 1.4-0.6 % SOLN Place 1 drop into both eyes 2 (two) times daily as needed (for dry eyes).   prn at prn  . sotalol (BETAPACE) 80 MG tablet Take 80 mg by mouth 2 (two) times daily.   05/11/2018 at 0800  . furosemide (LASIX) 40 MG tablet Take 40 mg by mouth daily.  Not Taking at Unknown time  . HYDROcodone-acetaminophen (NORCO) 5-325 MG tablet Take 1 tablet by mouth every 6 (six) hours as needed for moderate pain. (Patient not taking: Reported on 05/11/2018) 15 tablet 0 Completed Course at Unknown time   Social History   Socioeconomic History  . Marital status: Married    Spouse name: Not on file  . Number of children: Not on file  . Years of education: Not on file  . Highest education level: Not on file  Occupational History  . Not on file  Social Needs  . Financial resource strain: Not on file  . Food insecurity:    Worry: Not on file    Inability: Not on file  .  Transportation needs:    Medical: Not on file    Non-medical: Not on file  Tobacco Use  . Smoking status: Never Smoker  . Smokeless tobacco: Never Used  Substance and Sexual Activity  . Alcohol use: Not on file  . Drug use: No  . Sexual activity: Not on file  Lifestyle  . Physical activity:    Days per week: Not on file    Minutes per session: Not on file  . Stress: Not on file  Relationships  . Social connections:    Talks on phone: Not on file    Gets together: Not on file    Attends religious service: Not on file    Active member of club or organization: Not on file    Attends meetings of clubs or organizations: Not on file    Relationship status: Not on file  . Intimate partner violence:    Fear of current or ex partner: Not on file    Emotionally abused: Not on file    Physically abused: Not on file    Forced sexual activity: Not on file  Other Topics Concern  . Not on file  Social History Narrative  . Not on file    Family History  Problem Relation Age of Onset  . Heart failure Father       Review of systems complete and found to be negative unless listed above      PHYSICAL EXAM  General: Well developed, well nourished, in no acute distress HEENT:  Normocephalic and atramatic Neck:  No JVD.  Lungs: Clear bilaterally to auscultation and percussion. Heart: HRRR . Normal S1 and S2 without gallops or murmurs.  Abdomen: Bowel sounds are positive, abdomen soft and non-tender  Msk:  Back normal, normal gait. Normal strength and tone for age. Extremities: No clubbing, cyanosis or edema.   Neuro: Alert and oriented X 3. Psych:  Good affect, responds appropriately  Labs:   Lab Results  Component Value Date   WBC 8.8 05/12/2018   HGB 11.7 (L) 05/12/2018   HCT 35.9 (L) 05/12/2018   MCV 94.5 05/12/2018   PLT 244 05/12/2018    Recent Labs  Lab 05/12/18 0319  NA 134*  K 3.6  CL 103  CO2 24  BUN 17  CREATININE 1.01*  CALCIUM 8.1*  GLUCOSE 97   Lab  Results  Component Value Date   TROPONINI 0.51 (HH) 05/12/2018   No results found for: CHOL No results found for: HDL No results found for: LDLCALC No results found for: TRIG No results found for: CHOLHDL No results found for: LDLDIRECT    Radiology: Ct Abdomen Pelvis W Contrast  Result Date: 04/22/2018 CLINICAL DATA:  HX kyphoplasty and diverticulosis. No hx surg to a/p, no recent  trauma. No ca hx. C/O nausea and LLQ pain with intermittent constipation x 2 months. EXAM: CT ABDOMEN AND PELVIS WITH CONTRAST TECHNIQUE: Multidetector CT imaging of the abdomen and pelvis was performed using the standard protocol following bolus administration of intravenous contrast. CONTRAST:  84mL ISOVUE-300 IOPAMIDOL (ISOVUE-300) INJECTION 61% COMPARISON:  None. FINDINGS: Lower chest: No acute abnormality. Hepatobiliary: No focal liver abnormality is seen. No gallstones, gallbladder wall thickening, or biliary dilatation. Pancreas: Unremarkable. No pancreatic ductal dilatation or surrounding inflammatory changes. Spleen: Normal in size without focal abnormality. Adrenals/Urinary Tract: Normal adrenals. No hydronephrosis. 1.2 cm exophytic probable cyst from the upper pole left kidney. Poorly marginated 3 cm low-attenuation process in the lower pole of the right kidney, with adjacent mild inflammatory/edematous changes in the perinephric fat. Urinary bladder decompressed, unremarkable. Stomach/Bowel: Stomach and small bowel are decompressed. Normal appendix. Scattered diverticula from descending and sigmoid portions of the colon without adjacent inflammatory/edematous change or abscess. Vascular/Lymphatic: Moderate calcified aortoiliac atheromatous plaque without aneurysm or stenosis. No abdominal or pelvic adenopathy. Portal vein patent. Reproductive: Uterus and bilateral adnexa are unremarkable. Other: No ascites. No free air. Musculoskeletal: Compression deformity T12, post cement augmentation. No acute fracture or  worrisome bone lesion. IMPRESSION: 1. 3 cm low-attenuation process in the lower pole right kidney with adjacent inflammatory/edematous changes suggesting pyelonephritis. Recommend short-term follow-up CT to confirm appropriate resolution and exclude developing renal abscess or mass. 2. Descending and sigmoid diverticulosis. Electronically Signed   By: Corlis Leak M.D.   On: 04/22/2018 16:36   Dg Chest Port 1 View  Result Date: 05/11/2018 CLINICAL DATA:  Pt complains of heart racing and CP, states HR 150s at home. CP described as tight hx of a-fib EXAM: PORTABLE CHEST - 1 VIEW COMPARISON:  none FINDINGS: No focal infiltrate. Coarse interstitial markings at the lung bases. No overt edema. Monitoring leads overlie the left hemithorax. Heart size normal.  Aortic Atherosclerosis (ICD10-170.0). No pneumothorax.  No definite effusion. Visualized bones unremarkable. IMPRESSION: No acute cardiopulmonary disease. Electronically Signed   By: Corlis Leak M.D.   On: 05/11/2018 17:54    EKG: Atrial fibrillation with aberrancy, with rapid ventricular rate  ASSESSMENT AND PLAN:   1.  Atrial fibrillation with rapid ventricular rate, status post successful cardioversion, on Eliquis for stroke prevention 2.  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  Agree with current therapy 2.  Continue Eliquis for stroke prevention 3.  Consider changing sotalol to flecainide (will discuss with Dr. Gerre Pebbles [facility] )  Signed: Marcina Millard MD,PhD, [facility] 05/12/2018, 8:26 AM

## 2018-05-12 NOTE — Care Management CC44 (Signed)
Condition Code 44 Documentation Completed  Patient Details  Name: [name] MRN: [id number] Date of Birth: 1940-12-30   Condition Code 44 given:  Yes Patient signature on Condition Code 44 notice:  Yes Documentation of 2 MD's agreement:  Yes Code 44 added to claim:  Yes    Sherren Kerns, RN 05/12/2018, 11:45 AM

## 2018-05-13 ENCOUNTER — Ambulatory Visit: Admission: RE | Admit: 2018-05-13 | Payer: Medicare Other | Source: Ambulatory Visit

## 2018-05-13 LAB — T3, FREE: T3 FREE: 2.1 pg/mL (ref 2.0–4.4)

## 2018-06-02 ENCOUNTER — Ambulatory Visit
Admission: RE | Admit: 2018-06-02 | Discharge: 2018-06-02 | Disposition: A | Discharge status: Home / Self Care | Payer: Medicare Other | Source: Ambulatory Visit | Attending: Gastroenterology | Admitting: Gastroenterology

## 2018-06-02 DIAGNOSIS — R9389 Abnormal findings on diagnostic imaging of other specified body structures: Secondary | ICD-10-CM | POA: Diagnosis present

## 2018-06-02 MED ORDER — IOPAMIDOL (ISOVUE-300) INJECTION 61%
85.0000 mL | Freq: Once | INTRAVENOUS | Status: AC | PRN
  Administered 2018-06-02: 85 mL via INTRAVENOUS

## 2018-10-20 ENCOUNTER — Emergency Department
Admission: EM | Admit: 2018-10-20 | Discharge: 2018-10-20 | Disposition: A | Discharge status: Home / Self Care | Payer: Medicare Other | Attending: Emergency Medicine | Admitting: Emergency Medicine

## 2018-10-20 ENCOUNTER — Other Ambulatory Visit: Payer: Self-pay

## 2018-10-20 ENCOUNTER — Emergency Department: Payer: Medicare Other

## 2018-10-20 DIAGNOSIS — R0602 Shortness of breath: Secondary | ICD-10-CM | POA: Insufficient documentation

## 2018-10-20 DIAGNOSIS — R002 Palpitations: Secondary | ICD-10-CM | POA: Diagnosis present

## 2018-10-20 DIAGNOSIS — I10 Essential (primary) hypertension: Secondary | ICD-10-CM | POA: Diagnosis not present

## 2018-10-20 DIAGNOSIS — E039 Hypothyroidism, unspecified: Secondary | ICD-10-CM | POA: Diagnosis not present

## 2018-10-20 DIAGNOSIS — Z79899 Other long term (current) drug therapy: Secondary | ICD-10-CM | POA: Insufficient documentation

## 2018-10-20 DIAGNOSIS — I4891 Unspecified atrial fibrillation: Secondary | ICD-10-CM | POA: Insufficient documentation

## 2018-10-20 DIAGNOSIS — J45909 Unspecified asthma, uncomplicated: Secondary | ICD-10-CM | POA: Insufficient documentation

## 2018-10-20 DIAGNOSIS — Z7901 Long term (current) use of anticoagulants: Secondary | ICD-10-CM | POA: Insufficient documentation

## 2018-10-20 DIAGNOSIS — J449 Chronic obstructive pulmonary disease, unspecified: Secondary | ICD-10-CM | POA: Diagnosis not present

## 2018-10-20 LAB — CBC WITH DIFFERENTIAL/PLATELET
Abs Immature Granulocytes: 0.03 10*3/uL (ref 0.00–0.07)
Basophils Absolute: 0.1 10*3/uL (ref 0.0–0.1)
Basophils Relative: 1 %
Eosinophils Absolute: 0.1 10*3/uL (ref 0.0–0.5)
Eosinophils Relative: 1 %
HCT: 40 % (ref 36.0–46.0)
Hemoglobin: 13.4 g/dL (ref 12.0–15.0)
Immature Granulocytes: 0 %
Lymphocytes Relative: 15 %
Lymphs Abs: 1.5 10*3/uL (ref 0.7–4.0)
MCH: 30.7 pg (ref 26.0–34.0)
MCHC: 33.5 g/dL (ref 30.0–36.0)
MCV: 91.5 fL (ref 80.0–100.0)
Monocytes Absolute: 0.7 10*3/uL (ref 0.1–1.0)
Monocytes Relative: 7 %
Neutro Abs: 7.8 10*3/uL — ABNORMAL HIGH (ref 1.7–7.7)
Neutrophils Relative %: 76 %
Platelets: 281 10*3/uL (ref 150–400)
RBC: 4.37 MIL/uL (ref 3.87–5.11)
RDW: 12.9 % (ref 11.5–15.5)
WBC: 10.3 10*3/uL (ref 4.0–10.5)
nRBC: 0 % (ref 0.0–0.2)

## 2018-10-20 LAB — COMPREHENSIVE METABOLIC PANEL
ALT: 16 U/L (ref 0–44)
AST: 21 U/L (ref 15–41)
Albumin: 3.7 g/dL (ref 3.5–5.0)
Alkaline Phosphatase: 95 U/L (ref 38–126)
Anion gap: 10 (ref 5–15)
BUN: 20 mg/dL (ref 8–23)
CO2: 24 mmol/L (ref 22–32)
Calcium: 9 mg/dL (ref 8.9–10.3)
Chloride: 102 mmol/L (ref 98–111)
Creatinine, Ser: 1.05 mg/dL — ABNORMAL HIGH (ref 0.44–1.00)
GFR calc Af Amer: 58 mL/min — ABNORMAL LOW (ref >60)
GFR calc non Af Amer: 50 mL/min — ABNORMAL LOW (ref >60)
Glucose, Bld: 142 mg/dL — ABNORMAL HIGH (ref 70–99)
Potassium: 3.8 mmol/L (ref 3.5–5.1)
Sodium: 136 mmol/L (ref 135–145)
Total Bilirubin: 0.2 mg/dL — ABNORMAL LOW (ref 0.3–1.2)
Total Protein: 6.9 g/dL (ref 6.5–8.1)

## 2018-10-20 LAB — BRAIN NATRIURETIC PEPTIDE: B Natriuretic Peptide: 315 pg/mL — ABNORMAL HIGH (ref 0.0–100.0)

## 2018-10-20 LAB — TROPONIN I (HIGH SENSITIVITY): Troponin I (High Sensitivity): 5 ng/L (ref <18)

## 2018-10-20 MED ORDER — DILTIAZEM HCL 60 MG PO TABS
30.0000 mg | ORAL_TABLET | Freq: Once | ORAL | Status: AC
  Administered 2018-10-20: 17:00:00 30 mg via ORAL
  Filled 2018-10-20: qty 1

## 2018-10-20 NOTE — Discharge Instructions (Addendum)
Call Dr. Alveria Apley office tomorrow to arrange for follow-up and determine next steps.  Continue to take your normal medications including your Eliquis and metoprolol tonight as prescribed.  Return to the ER immediately if you have recurrent or persistent elevated heart rate, anything higher than 120-130, or if you have worsening shortness of breath, lightheadedness, or chest pain.

## 2018-10-20 NOTE — ED Provider Notes (Signed)
[facility] Emergency Department Provider Note ____________________________________________   First MD Initiated Contact with Patient 10/20/18 [phone]     (approximate)  I have reviewed the triage vital signs and the nursing notes.   HISTORY  Chief Complaint Palpitations    HPI Sarah Herring is a 78 y.o. female with PMH as noted below who presents with rapid heart rate, felt as palpitations, acute onset yesterday and persistent since that time.  She went to her cardiologist's office today and and was sent to the ED due to a heart rate in the 190s.  The patient states that she has had multiple similar episodes, which she can often break with vagal maneuvers.  She was in the hospital in January with a similar episode that required cardioversion.  She reports some lightheadedness and shortness of breath.  She denies significant lower extremity swelling.  She took her normal medications today including Eliquis.  She does have Cardizem which she takes sometimes for breakthrough tachycardia but was afraid to take it.  Past Medical History:  Diagnosis Date   Asthma    Atrial fibrillation (HCC)    COPD (chronic obstructive pulmonary disease) (HCC)    Dyspnea    GERD (gastroesophageal reflux disease)    Glaucoma    Hypercholesteremia    Hypertension    Hypothyroidism    Osteoporosis     Patient Active Problem List   Diagnosis Date Noted   Atrial fibrillation with RVR (HCC) 05/11/2018   Ventricular arrhythmia 05/11/2018    Past Surgical History:  Procedure Laterality Date   ABLATION     CARDIOVERSION N/A 05/21/2016   Procedure: CARDIOVERSION;  Surgeon: Lamar BlinksBruce J Kowalski, MD;  Location: [facility] ORS;  Service: Cardiovascular;  Laterality: N/A;   COLONOSCOPY     KYPHOPLASTY N/A 12/30/2017   Procedure: Dorcas CarrowKYPHOPLASTY-T12;  Surgeon: Kennedy BuckerMenz, Michael, MD;  Location: [facility] ORS;  Service: Orthopedics;  Laterality: N/A;    Prior to Admission medications     Medication Sig Start Date End Date Taking? Authorizing Provider  diltiazem (CARDIZEM CD) 120 MG 24 hr capsule Take 120 mg by mouth daily. 06/27/18 06/27/19 Yes [provider]  furosemide (LASIX) 40 MG tablet Take 40 mg by mouth daily. As needed for edema 06/16/18  Yes [provider]  metoprolol tartrate (LOPRESSOR) 50 MG tablet Take 50 mg by mouth 2 (two) times a day. 06/27/18 06/27/19 Yes [provider]  acetaminophen (TYLENOL) 500 MG tablet Take 1,000 mg by mouth every 6 (six) hours as needed for moderate pain or headache.     [provider]  apixaban (ELIQUIS) 5 MG TABS tablet Take 5 mg by mouth 2 (two) times daily.    [provider]  atorvastatin (LIPITOR) 20 MG tablet Take 20 mg by mouth daily.    [provider]  Calcium Carb-Cholecalciferol (CALCIUM 600/VITAMIN D3 PO) Take 1 tablet by mouth daily.    [provider]  Cholecalciferol (VITAMIN D) 2000 units CAPS Take 4,000 Units by mouth daily.     [provider]  latanoprost (XALATAN) 0.005 % ophthalmic solution Place 1 drop into both eyes at bedtime.    [provider]  levothyroxine (SYNTHROID, LEVOTHROID) 112 MCG tablet Take 112 mcg by mouth daily before breakfast.    [provider]  magnesium oxide (MAG-OX) 400 MG tablet Take 400 mg by mouth 2 (two) times daily.     [provider]  Multiple Vitamin (MULTIVITAMIN WITH MINERALS) TABS tablet Take 1 tablet by  mouth daily.    [provider]  omeprazole (PRILOSEC) 20 MG capsule Take 20 mg by mouth daily.    [provider]  Polyvinyl Alcohol-Povidone PF (REFRESH) 1.4-0.6 % SOLN Place 1 drop into both eyes 2 (two) times daily as needed (for dry eyes).    [provider]    Allergies Sulfur  Family History  Problem Relation Age of Onset   Heart failure Father     Social History Social History   Tobacco Use   Smoking status: Never Smoker   Smokeless  tobacco: Never Used  Substance Use Topics   Alcohol use: Never    Frequency: Never   Drug use: No    Review of Systems  Constitutional: No fever. Eyes: No redness. ENT: No sore throat. Cardiovascular: Denies chest pain. Respiratory: Positive for shortness of breath. Gastrointestinal: No vomiting or diarrhea.  Genitourinary: Negative for flank pain.  Musculoskeletal: Negative for back pain. Skin: Negative for rash. Neurological: Negative for headache.   ____________________________________________   PHYSICAL EXAM:  VITAL SIGNS: ED Triage Vitals  Enc Vitals Group     BP 10/20/18 1658 117/81     Pulse --      Resp --      Temp --      Temp src --      SpO2 --      Weight 10/20/18 1629 189 lb (85.7 kg)     Height 10/20/18 1629 5\' 3"  (1.6 m)     Head Circumference --      Peak Flow --      Pain Score 10/20/18 1629 4     Pain Loc --      Pain Edu? --      Excl. in GC? --     Constitutional: Alert and oriented.  Relatively well appearing and in no acute distress. Eyes: Conjunctivae are normal.  Head: Atraumatic. Nose: No congestion/rhinnorhea. Mouth/Throat: Mucous membranes are moist.   Neck: Normal range of motion.  Cardiovascular: Tachycardic, regular rhythm. Grossly normal heart sounds.  Good peripheral circulation. Respiratory: Normal respiratory effort.  No retractions. Lungs CTAB. Gastrointestinal: No distention.  Musculoskeletal: No lower extremity edema.  Extremities warm and well perfused.  Neurologic:  Normal speech and language. No gross focal neurologic deficits are appreciated.  Skin:  Skin is warm and dry. No rash noted. Psychiatric: Mood and affect are normal. Speech and behavior are normal.  ____________________________________________   LABS (all labs ordered are listed, but only abnormal results are displayed)  Labs Reviewed  CBC WITH DIFFERENTIAL/PLATELET - Abnormal; Notable for the following components:      Result Value   Neutro Abs  7.8 (*)    All other components within normal limits  COMPREHENSIVE METABOLIC PANEL - Abnormal; Notable for the following components:   Glucose, Bld 142 (*)    Creatinine, Ser 1.05 (*)    Total Bilirubin 0.2 (*)    GFR calc non Af Amer 50 (*)    GFR calc Af Amer 58 (*)    All other components within normal limits  BRAIN NATRIURETIC PEPTIDE - Abnormal; Notable for the following components:   B Natriuretic Peptide 315.0 (*)    All other components within normal limits  TROPONIN I (HIGH SENSITIVITY)  TROPONIN I (HIGH SENSITIVITY)   ____________________________________________  EKG  ED ECG REPORT I, Dionne BucySebastian Keymon Mcelroy, the attending physician, personally viewed and interpreted this ECG.  Date: 10/20/2018 EKG Time: 1607 Rate: 194 Rhythm: Wide-complex tachycardia, irregular QRS Axis: normal Intervals:  normal ST/T Wave abnormalities: Nonspecific ST abnormalities Narrative Interpretation: Nonspecific wide-complex tachycardia with some irregularity, likely atrial fibrillation with aberrancy; similar to EKG from ED visit on 05/12/2018   ED ECG REPORT I, Arta Silence, the attending physician, personally viewed and interpreted this ECG.  Date: 10/20/2018 EKG Time: 1639 Rate: 109  Rhythm: Dennis tachycardia QRS Axis: Borderline left axis Intervals: normal ST/T Wave abnormalities: normal Narrative Interpretation: no evidence of acute ischemia  ____________________________________________  RADIOLOGY  CXR: No focal infiltrate or other acute abnormality  ____________________________________________   PROCEDURES  Procedure(s) performed: No  Procedures  Critical Care performed: Yes  CRITICAL CARE Performed by: Arta Silence   Total critical care time:30 minutes  Critical care time was exclusive of separately billable procedures and treating other patients.  Critical care was necessary to treat or prevent imminent or life-threatening  deterioration.  Critical care was time spent personally by me on the following activities: development of treatment plan with patient and/or surrogate as well as nursing, discussions with consultants, evaluation of patient's response to treatment, examination of patient, obtaining history from patient or surrogate, ordering and performing treatments and interventions, ordering and review of laboratory studies, ordering and review of radiographic studies, pulse oximetry and re-evaluation of patient's condition.  ____________________________________________   INITIAL IMPRESSION / ASSESSMENT AND PLAN / ED COURSE  Pertinent labs & imaging results that were available during my care of the patient were reviewed by me and considered in my medical decision making (see chart for details).  78 year old female with PMH as noted above including paroxysmal atrial fibrillation for which she is on Eliquis presents with palpitations and tachycardia since yesterday.  She was unable to control this at home with vagal maneuvers.  She has Cardizem which she sometimes takes for this, but was afraid to take it.  She went to her cardiologist's office and was sent to the ED.  EKG there showed wide-complex tachycardia with some irregularity and rate in the 190s.  I reviewed the past medical records in [place].  The patient had a very similar episode in January of this year with similar appearing wide-complex tachycardia, thought to be atrial fibrillation with aberrancy (although her complex appears narrow when she is not in this tachydysrhythmia) which required cardioversion.  By the time we hooked the patient up to the monitor in the room, her heart rate was 110 and EKG now shows sinus rhythm with a narrow complex.  However, even at this rate the patient is still symptomatic and feels the palpitations and lightheadedness.  Therefore, I will give a p.o. dose of Cardizem for rate control.  We will obtain lab work-up to rule out  significant ischemia or acute CHF.  I will consult cardiology to discuss appropriate disposition.  ----------------------------------------- 7:41 PM on 10/20/2018 -----------------------------------------  Patient's heart rate remains around 105-110 and in sinus with a narrow complex.  The patient states that she has no symptoms at this time.  Lab work-up is unremarkable except for minimally elevated BNP but there is no evidence of pulmonary edema or acute CHF on the chest x-ray.  Troponin is negative.  Her electrolytes are normal.  The patient has a very strong preference to go home if at all possible.  I offered admission for monitoring overnight but the patient declines.  I consulted Dr. Clayborn Bigness from cardiology and discussed the case with him.  He advised that if the patient wanted to go home and was no longer in the rapid rhythm, this would be appropriate and  she could call Dr. Gwen PoundsKowalski tomorrow to arrange for follow-up.  I had an extensive discussion with the patient about the results of her work-up and the plan of care.  I instructed her to continue her normal metoprolol and Eliquis.  I instructed her to return to the ER immediately if she has persistent elevated heart rate or recurrent symptoms.  She is stable for discharge at this time. ____________________________________________   FINAL CLINICAL IMPRESSION(S) / ED DIAGNOSES  Final diagnoses:  Atrial fibrillation with RVR (HCC)      NEW MEDICATIONS STARTED DURING THIS VISIT:  New Prescriptions   No medications on file     Note:  This document was prepared using Dragon voice recognition software and may include unintentional dictation errors.    Dionne BucySiadecki, Breland Trouten, MD 10/20/18 1943

## 2018-10-20 NOTE — ED Triage Notes (Signed)
Pt arrived from cardiologist office for rapid heart rate alternating with bradycardia - pt c/o chest discomfort that radiates across her back - c/o dizziness and SHOB - pt reports sxs since yesterday am

## 2018-11-09 DIAGNOSIS — Z950 Presence of cardiac pacemaker: Secondary | ICD-10-CM

## 2018-11-09 HISTORY — DX: Presence of cardiac pacemaker: Z95.0

## 2019-10-03 ENCOUNTER — Encounter: Payer: Self-pay | Admitting: Ophthalmology

## 2019-10-03 ENCOUNTER — Other Ambulatory Visit: Payer: Self-pay

## 2019-10-09 ENCOUNTER — Other Ambulatory Visit: Admission: RE | Admit: 2019-10-09 | Payer: Medicare Other | Source: Ambulatory Visit

## 2019-10-09 NOTE — Discharge Instructions (Signed)

## 2019-10-11 ENCOUNTER — Ambulatory Visit: Payer: Medicare Other | Admitting: Anesthesiology

## 2019-10-11 ENCOUNTER — Encounter
Admission: RE | Disposition: A | Discharge status: Home / Self Care | Payer: Self-pay | Source: Home / Self Care | Attending: Ophthalmology

## 2019-10-11 ENCOUNTER — Ambulatory Visit
Admission: RE | Admit: 2019-10-11 | Discharge: 2019-10-11 | Disposition: A | Discharge status: Home / Self Care | Payer: Medicare Other | Attending: Ophthalmology | Admitting: Ophthalmology

## 2019-10-11 ENCOUNTER — Other Ambulatory Visit: Payer: Self-pay

## 2019-10-11 ENCOUNTER — Encounter: Payer: Self-pay | Admitting: Ophthalmology

## 2019-10-11 DIAGNOSIS — E7800 Pure hypercholesterolemia, unspecified: Secondary | ICD-10-CM | POA: Diagnosis not present

## 2019-10-11 DIAGNOSIS — H2512 Age-related nuclear cataract, left eye: Secondary | ICD-10-CM | POA: Insufficient documentation

## 2019-10-11 DIAGNOSIS — I4891 Unspecified atrial fibrillation: Secondary | ICD-10-CM | POA: Diagnosis not present

## 2019-10-11 DIAGNOSIS — Z6832 Body mass index (BMI) 32.0-32.9, adult: Secondary | ICD-10-CM | POA: Insufficient documentation

## 2019-10-11 DIAGNOSIS — Z7901 Long term (current) use of anticoagulants: Secondary | ICD-10-CM | POA: Insufficient documentation

## 2019-10-11 DIAGNOSIS — E039 Hypothyroidism, unspecified: Secondary | ICD-10-CM | POA: Diagnosis not present

## 2019-10-11 DIAGNOSIS — I110 Hypertensive heart disease with heart failure: Secondary | ICD-10-CM | POA: Diagnosis not present

## 2019-10-11 DIAGNOSIS — Z79890 Hormone replacement therapy (postmenopausal): Secondary | ICD-10-CM | POA: Diagnosis not present

## 2019-10-11 DIAGNOSIS — E669 Obesity, unspecified: Secondary | ICD-10-CM | POA: Insufficient documentation

## 2019-10-11 DIAGNOSIS — K219 Gastro-esophageal reflux disease without esophagitis: Secondary | ICD-10-CM | POA: Diagnosis not present

## 2019-10-11 DIAGNOSIS — I509 Heart failure, unspecified: Secondary | ICD-10-CM | POA: Insufficient documentation

## 2019-10-11 DIAGNOSIS — D649 Anemia, unspecified: Secondary | ICD-10-CM | POA: Insufficient documentation

## 2019-10-11 DIAGNOSIS — Z79899 Other long term (current) drug therapy: Secondary | ICD-10-CM | POA: Diagnosis not present

## 2019-10-11 DIAGNOSIS — Z950 Presence of cardiac pacemaker: Secondary | ICD-10-CM | POA: Insufficient documentation

## 2019-10-11 DIAGNOSIS — I495 Sick sinus syndrome: Secondary | ICD-10-CM | POA: Insufficient documentation

## 2019-10-11 HISTORY — PX: CATARACT EXTRACTION W/PHACO: SHX586

## 2019-10-11 HISTORY — DX: Dizziness and giddiness: R42

## 2019-10-11 HISTORY — DX: Migraine, unspecified, not intractable, without status migrainosus: G43.909

## 2019-10-11 SURGERY — PHACOEMULSIFICATION, CATARACT, WITH IOL INSERTION
Anesthesia: Monitor Anesthesia Care | Site: Eye | Laterality: Left

## 2019-10-11 MED ORDER — EPINEPHRINE PF 1 MG/ML IJ SOLN
INTRAOCULAR | Status: DC | PRN
  Administered 2019-10-11: 59 mL via OPHTHALMIC

## 2019-10-11 MED ORDER — LIDOCAINE HCL (PF) 2 % IJ SOLN
INTRAOCULAR | Status: DC | PRN
  Administered 2019-10-11: 2 mL

## 2019-10-11 MED ORDER — TIMOLOL MALEATE 0.5 % OP SOLN
OPHTHALMIC | Status: DC | PRN
  Administered 2019-10-11: 1 [drp] via OPHTHALMIC

## 2019-10-11 MED ORDER — MIDAZOLAM HCL 2 MG/2ML IJ SOLN
INTRAMUSCULAR | Status: DC | PRN
  Administered 2019-10-11: 1 mg via INTRAVENOUS

## 2019-10-11 MED ORDER — CEFUROXIME OPHTHALMIC INJECTION 1 MG/0.1 ML
INJECTION | OPHTHALMIC | Status: DC | PRN
  Administered 2019-10-11: 0.1 mL via INTRACAMERAL

## 2019-10-11 MED ORDER — NA HYALUR & NA CHOND-NA HYALUR 0.4-0.35 ML IO KIT
PACK | INTRAOCULAR | Status: DC | PRN
  Administered 2019-10-11: 1 mL via INTRAOCULAR

## 2019-10-11 MED ORDER — ARMC OPHTHALMIC DILATING DROPS
1.0000 "application " | OPHTHALMIC | Status: DC | PRN
  Administered 2019-10-11 (×3): 1 via OPHTHALMIC

## 2019-10-11 MED ORDER — TETRACAINE HCL 0.5 % OP SOLN
1.0000 [drp] | OPHTHALMIC | Status: DC | PRN
  Administered 2019-10-11 (×3): 1 [drp] via OPHTHALMIC

## 2019-10-11 MED ORDER — FENTANYL CITRATE (PF) 100 MCG/2ML IJ SOLN
INTRAMUSCULAR | Status: DC | PRN
  Administered 2019-10-11: 50 ug via INTRAVENOUS

## 2019-10-11 MED ORDER — MOXIFLOXACIN HCL 0.5 % OP SOLN
1.0000 [drp] | OPHTHALMIC | Status: DC | PRN
  Administered 2019-10-11 (×3): 1 [drp] via OPHTHALMIC

## 2019-10-11 SURGICAL SUPPLY — 23 items
CANNULA ANT/CHMB 27G (MISCELLANEOUS) ×1 IMPLANT
CANNULA ANT/CHMB 27GA (MISCELLANEOUS) ×3 IMPLANT
GLOVE SURG LX 7.5 STRW (GLOVE) ×2
GLOVE SURG LX STRL 7.5 STRW (GLOVE) ×1 IMPLANT
GLOVE SURG TRIUMPH 8.0 PF LTX (GLOVE) ×3 IMPLANT
GOWN STRL REUS W/ TWL LRG LVL3 (GOWN DISPOSABLE) ×2 IMPLANT
GOWN STRL REUS W/TWL LRG LVL3 (GOWN DISPOSABLE) ×6
LENS IOL DIOP 25.0 (Intraocular Lens) ×3 IMPLANT
LENS IOL TECNIS MONO 25.0 (Intraocular Lens) IMPLANT
MARKER SKIN DUAL TIP RULER LAB (MISCELLANEOUS) ×3 IMPLANT
NDL CAPSULORHEX 25GA (NEEDLE) ×1 IMPLANT
NDL FILTER BLUNT 18X1 1/2 (NEEDLE) ×2 IMPLANT
NEEDLE CAPSULORHEX 25GA (NEEDLE) ×3 IMPLANT
NEEDLE FILTER BLUNT 18X 1/2SAF (NEEDLE) ×4
NEEDLE FILTER BLUNT 18X1 1/2 (NEEDLE) ×2 IMPLANT
PACK CATARACT BRASINGTON (MISCELLANEOUS) ×3 IMPLANT
PACK EYE AFTER SURG (MISCELLANEOUS) ×3 IMPLANT
PACK OPTHALMIC (MISCELLANEOUS) ×3 IMPLANT
SOLUTION OPHTHALMIC SALT (MISCELLANEOUS) ×3 IMPLANT
SYR 3ML LL SCALE MARK (SYRINGE) ×6 IMPLANT
SYR TB 1ML LUER SLIP (SYRINGE) ×3 IMPLANT
WATER STERILE IRR 250ML POUR (IV SOLUTION) ×3 IMPLANT
WIPE NON LINTING 3.25X3.25 (MISCELLANEOUS) ×3 IMPLANT

## 2019-10-11 NOTE — Anesthesia Preprocedure Evaluation (Addendum)
Anesthesia Evaluation  Patient identified by MRN, date of birth, ID band Patient awake    History of Anesthesia Complications Negative for: history of anesthetic complications  Airway Mallampati: II  TM Distance: >3 FB Neck ROM: Full    Dental no notable dental hx.    Pulmonary asthma (mild, no inhalers) ,    Pulmonary exam normal        Cardiovascular hypertension, Pt. on home beta blockers and Pt. on medications Normal cardiovascular exam+ dysrhythmias (on Eliquis) Atrial Fibrillation + pacemaker (sick sinus syndrome)   2020 echo  IMPRESSIONS    1. The left ventricle has 60-65%. The cavity size is normal. There is  normal left ventricular hypertrophy. Echo evidence of normal diastolic  filling patterns.  2. Mildly dilated left atrial size.  3. Normal right atrial size.  4. The mitral valve normal in structure. Regurgitation is moderate to  severe by color flow Doppler.  5. Normal tricuspid valve.  6. Tricuspid regurgitation is mild.  7. No atrial level shunt detected by color flow Doppler.   Neuro/Psych negative neurological ROS  negative psych ROS   GI/Hepatic GERD  Medicated,  Endo/Other  Hypothyroidism Obese BMI 32  Renal/GU      Musculoskeletal   Abdominal   Peds  Hematology   Anesthesia Other Findings   Reproductive/Obstetrics                           Anesthesia Physical Anesthesia Plan  ASA: III  Anesthesia Plan: MAC   Post-op Pain Management:    Induction: Intravenous  PONV Risk Score and Plan: 2 and Midazolam, TIVA and Treatment may vary due to age or medical condition  Airway Management Planned: Nasal Cannula and Natural Airway  Additional Equipment: None  Intra-op Plan:   Post-operative Plan:   Informed Consent: I have reviewed the patients History and Physical, chart, labs and discussed the procedure including the risks, benefits and alternatives for  the proposed anesthesia with the patient or authorized representative who has indicated his/her understanding and acceptance.       Plan Discussed with: CRNA  Anesthesia Plan Comments:         Anesthesia Quick Evaluation

## 2019-10-11 NOTE — H&P (Signed)

## 2019-10-11 NOTE — Anesthesia Procedure Notes (Signed)
Procedure Name: MAC Date/Time: 10/11/2019 12:00 PM Performed by: Silvana Newness, CRNA Pre-anesthesia Checklist: Patient identified, Emergency Drugs available, Suction available, Patient being monitored and Timeout performed Patient Re-evaluated:Patient Re-evaluated prior to induction Oxygen Delivery Method: Nasal cannula

## 2019-10-11 NOTE — Transfer of Care (Signed)
Immediate Anesthesia Transfer of Care Note  Patient: Sarah Herring  Procedure(s) Performed: CATARACT EXTRACTION PHACO AND INTRAOCULAR LENS PLACEMENT (IOC) LEFT 8.00 01:04.2 12.4% (Left Eye)  Patient Location: PACU  Anesthesia Type: MAC  Level of Consciousness: awake, alert  and patient cooperative  Airway and Oxygen Therapy: Patient Spontanous Breathing and Patient connected to supplemental oxygen  Post-op Assessment: Post-op Vital signs reviewed, Patient's Cardiovascular Status Stable, Respiratory Function Stable, Patent Airway and No signs of Nausea or vomiting  Post-op Vital Signs: Reviewed and stable  Complications: No complications documented.

## 2019-10-11 NOTE — Anesthesia Postprocedure Evaluation (Signed)
Anesthesia Post Note  Patient: Sarah Herring  Procedure(s) Performed: CATARACT EXTRACTION PHACO AND INTRAOCULAR LENS PLACEMENT (IOC) LEFT 8.00 01:04.2 12.4% (Left Eye)     Patient location during evaluation: PACU Anesthesia Type: MAC Level of consciousness: awake and alert Pain management: pain level controlled Vital Signs Assessment: post-procedure vital signs reviewed and stable Respiratory status: spontaneous breathing Cardiovascular status: blood pressure returned to baseline Postop Assessment: no apparent nausea or vomiting, adequate PO intake and no headache Anesthetic complications: no   No complications documented.  Adele Barthel Deckard Stuber

## 2019-10-11 NOTE — Op Note (Signed)
OPERATIVE NOTE  Sarah Herring [id number] 10/11/2019   PREOPERATIVE DIAGNOSIS:  Nuclear sclerotic cataract left eye. H25.12   POSTOPERATIVE DIAGNOSIS:    Nuclear sclerotic cataract left eye.     PROCEDURE:  Phacoemusification with posterior chamber intraocular lens placement of the left eye  Ultrasound time: Procedure(s): CATARACT EXTRACTION PHACO AND INTRAOCULAR LENS PLACEMENT (IOC) LEFT 8.00 01:04.2 12.4% (Left)  LENS:   Implant Name Type Inv. Item Serial No. Manufacturer Lot No. LRB No. Used Action  LENS IOL DIOP 25.0 - [id number] Intraocular Lens LENS IOL DIOP 25.0 [id number] [employer]  Left 1 Implanted      SURGEON:  Deirdre Evener, MD   ANESTHESIA:  Topical with tetracaine drops and 2% Xylocaine jelly, augmented with 1% preservative-free intracameral lidocaine.    COMPLICATIONS:  None.   DESCRIPTION OF PROCEDURE:  The patient was identified in the holding room and transported to the operating room and placed in the supine position under the operating microscope.  The left eye was identified as the operative eye and it was prepped and draped in the usual sterile ophthalmic fashion.   A 1 millimeter clear-corneal paracentesis was made at the 1:30 position.  0.5 ml of preservative-free 1% lidocaine was injected into the anterior chamber.  The anterior chamber was filled with Viscoat viscoelastic.  A 2.4 millimeter keratome was used to make a near-clear corneal incision at the 10:30 position.  .  A curvilinear capsulorrhexis was made with a cystotome and capsulorrhexis forceps.  Balanced salt solution was used to hydrodissect and hydrodelineate the nucleus.   Phacoemulsification was then used in stop and chop fashion to remove the lens nucleus and epinucleus.  The remaining cortex was then removed using the irrigation and aspiration handpiece. Provisc was then placed into the capsular bag to distend it for lens placement.  A lens was then injected into the  capsular bag.  The remaining viscoelastic was aspirated.   Wounds were hydrated with balanced salt solution.  The anterior chamber was inflated to a physiologic pressure with balanced salt solution.  No wound leaks were noted. Cefuroxime 0.1 ml of a 10mg /ml solution was injected into the anterior chamber for a dose of 1 mg of intracameral antibiotic at the completion of the case.   Timolol and Brimonidine drops were applied to the eye.  The patient was taken to the recovery room in stable condition without complications of anesthesia or surgery.  [name] 10/11/2019, 12:15 PM

## 2019-10-24 ENCOUNTER — Encounter: Payer: Self-pay | Admitting: Ophthalmology

## 2019-10-24 ENCOUNTER — Other Ambulatory Visit: Payer: Self-pay

## 2019-10-30 NOTE — Discharge Instructions (Signed)

## 2019-11-01 ENCOUNTER — Encounter
Admission: RE | Disposition: A | Discharge status: Home / Self Care | Payer: Self-pay | Source: Home / Self Care | Attending: Ophthalmology

## 2019-11-01 ENCOUNTER — Ambulatory Visit: Payer: Medicare Other | Admitting: Anesthesiology

## 2019-11-01 ENCOUNTER — Other Ambulatory Visit: Payer: Self-pay

## 2019-11-01 ENCOUNTER — Encounter: Payer: Self-pay | Admitting: Ophthalmology

## 2019-11-01 ENCOUNTER — Ambulatory Visit
Admission: RE | Admit: 2019-11-01 | Discharge: 2019-11-01 | Disposition: A | Discharge status: Home / Self Care | Payer: Medicare Other | Attending: Ophthalmology | Admitting: Ophthalmology

## 2019-11-01 DIAGNOSIS — H2511 Age-related nuclear cataract, right eye: Secondary | ICD-10-CM | POA: Insufficient documentation

## 2019-11-01 DIAGNOSIS — I110 Hypertensive heart disease with heart failure: Secondary | ICD-10-CM | POA: Diagnosis not present

## 2019-11-01 DIAGNOSIS — I509 Heart failure, unspecified: Secondary | ICD-10-CM | POA: Insufficient documentation

## 2019-11-01 DIAGNOSIS — D649 Anemia, unspecified: Secondary | ICD-10-CM | POA: Insufficient documentation

## 2019-11-01 DIAGNOSIS — Z79890 Hormone replacement therapy (postmenopausal): Secondary | ICD-10-CM | POA: Insufficient documentation

## 2019-11-01 DIAGNOSIS — Z7901 Long term (current) use of anticoagulants: Secondary | ICD-10-CM | POA: Insufficient documentation

## 2019-11-01 DIAGNOSIS — E039 Hypothyroidism, unspecified: Secondary | ICD-10-CM | POA: Diagnosis not present

## 2019-11-01 DIAGNOSIS — Z79899 Other long term (current) drug therapy: Secondary | ICD-10-CM | POA: Diagnosis not present

## 2019-11-01 DIAGNOSIS — Z950 Presence of cardiac pacemaker: Secondary | ICD-10-CM | POA: Insufficient documentation

## 2019-11-01 DIAGNOSIS — I4891 Unspecified atrial fibrillation: Secondary | ICD-10-CM | POA: Diagnosis not present

## 2019-11-01 DIAGNOSIS — J449 Chronic obstructive pulmonary disease, unspecified: Secondary | ICD-10-CM | POA: Insufficient documentation

## 2019-11-01 DIAGNOSIS — H401110 Primary open-angle glaucoma, right eye, stage unspecified: Secondary | ICD-10-CM | POA: Diagnosis not present

## 2019-11-01 DIAGNOSIS — K219 Gastro-esophageal reflux disease without esophagitis: Secondary | ICD-10-CM | POA: Insufficient documentation

## 2019-11-01 HISTORY — PX: CATARACT EXTRACTION W/PHACO: SHX586

## 2019-11-01 SURGERY — PHACOEMULSIFICATION, CATARACT, WITH IOL INSERTION
Anesthesia: Monitor Anesthesia Care | Site: Eye | Laterality: Right

## 2019-11-01 MED ORDER — ARMC OPHTHALMIC DILATING DROPS
1.0000 "application " | OPHTHALMIC | Status: DC | PRN
  Administered 2019-11-01 (×3): 1 via OPHTHALMIC

## 2019-11-01 MED ORDER — EPINEPHRINE PF 1 MG/ML IJ SOLN
INTRAOCULAR | Status: DC | PRN
  Administered 2019-11-01: 68 mL via OPHTHALMIC

## 2019-11-01 MED ORDER — LIDOCAINE HCL (CARDIAC) PF 100 MG/5ML IV SOSY
PREFILLED_SYRINGE | INTRAVENOUS | Status: DC | PRN
Start: 2019-11-01 — End: 2019-11-01
  Administered 2019-11-01: 25 mg via INTRATRACHEAL

## 2019-11-01 MED ORDER — NA HYALUR & NA CHOND-NA HYALUR 0.4-0.35 ML IO KIT
PACK | INTRAOCULAR | Status: DC | PRN
  Administered 2019-11-01 (×2): 1 mL via INTRAOCULAR

## 2019-11-01 MED ORDER — MOXIFLOXACIN HCL 0.5 % OP SOLN
1.0000 [drp] | OPHTHALMIC | Status: DC | PRN
  Administered 2019-11-01 (×3): 1 [drp] via OPHTHALMIC

## 2019-11-01 MED ORDER — CEFUROXIME OPHTHALMIC INJECTION 1 MG/0.1 ML
INJECTION | OPHTHALMIC | Status: DC | PRN
  Administered 2019-11-01: 0.1 mL via INTRACAMERAL

## 2019-11-01 MED ORDER — ACETAMINOPHEN 325 MG PO TABS: 325.0000 mg | ORAL_TABLET | ORAL | Status: DC | PRN

## 2019-11-01 MED ORDER — ACETAMINOPHEN 160 MG/5ML PO SOLN: 325.0000 mg | ORAL | Status: DC | PRN

## 2019-11-01 MED ORDER — PROPOFOL 10 MG/ML IV BOLUS
INTRAVENOUS | Status: DC | PRN
Start: 2019-11-01 — End: 2019-11-01
  Administered 2019-11-01: 40 mg via INTRAVENOUS
  Administered 2019-11-01 (×2): 20 mg via INTRAVENOUS

## 2019-11-01 MED ORDER — LACTATED RINGERS IV SOLN: INTRAVENOUS | Status: DC

## 2019-11-01 MED ORDER — LIDOCAINE HCL (PF) 2 % IJ SOLN
INTRAOCULAR | Status: DC | PRN
  Administered 2019-11-01: 1 mL

## 2019-11-01 MED ORDER — METOCLOPRAMIDE HCL 5 MG/ML IJ SOLN
INTRAMUSCULAR | Status: DC | PRN
  Administered 2019-11-01 (×2): 5 mg via INTRAVENOUS

## 2019-11-01 MED ORDER — TETRACAINE HCL 0.5 % OP SOLN
1.0000 [drp] | OPHTHALMIC | Status: DC | PRN
  Administered 2019-11-01 (×3): 1 [drp] via OPHTHALMIC

## 2019-11-01 SURGICAL SUPPLY — 31 items
BLADE DUAL KAHOOK SINGLE USE (BLADE) ×2 IMPLANT
CANNULA ANT/CHMB 27G (MISCELLANEOUS) ×1 IMPLANT
CANNULA ANT/CHMB 27GA (MISCELLANEOUS) ×3 IMPLANT
GLOVE SURG LX 7.5 STRW (GLOVE) ×4
GLOVE SURG LX STRL 7.5 STRW (GLOVE) ×1 IMPLANT
GLOVE SURG TRIUMPH 8.0 PF LTX (GLOVE) ×3 IMPLANT
GOWN STRL REUS W/ TWL LRG LVL3 (GOWN DISPOSABLE) ×2 IMPLANT
GOWN STRL REUS W/TWL LRG LVL3 (GOWN DISPOSABLE) ×6
ICLIP (OPHTHALMIC RELATED) ×2 IMPLANT
LENS IOL DIOP 19.0 (Intraocular Lens) ×3 IMPLANT
LENS IOL TECNIS MONO 19.0 (Intraocular Lens) IMPLANT
MARKER SKIN DUAL TIP RULER LAB (MISCELLANEOUS) ×3 IMPLANT
NDL CAPSULORHEX 25GA (NEEDLE) ×1 IMPLANT
NDL FILTER BLUNT 18X1 1/2 (NEEDLE) ×2 IMPLANT
NDL RETROBULBAR .5 NSTRL (NEEDLE) IMPLANT
NEEDLE CAPSULORHEX 25GA (NEEDLE) ×3 IMPLANT
NEEDLE FILTER BLUNT 18X 1/2SAF (NEEDLE) ×4
NEEDLE FILTER BLUNT 18X1 1/2 (NEEDLE) ×2 IMPLANT
PACK CATARACT BRASINGTON (MISCELLANEOUS) ×3 IMPLANT
PACK EYE AFTER SURG (MISCELLANEOUS) ×3 IMPLANT
PACK OPTHALMIC (MISCELLANEOUS) ×3 IMPLANT
RING MALYGIN 7.0 (MISCELLANEOUS) IMPLANT
SOLUTION OPHTHALMIC SALT (MISCELLANEOUS) ×3 IMPLANT
SUT ETHILON 10-0 CS-B-6CS-B-6 (SUTURE)
SUT VICRYL  9 0 (SUTURE)
SUT VICRYL 9 0 (SUTURE) IMPLANT
SUTURE EHLN 10-0 CS-B-6CS-B-6 (SUTURE) IMPLANT
SYR 3ML LL SCALE MARK (SYRINGE) ×6 IMPLANT
SYR TB 1ML LUER SLIP (SYRINGE) ×3 IMPLANT
WATER STERILE IRR 250ML POUR (IV SOLUTION) ×3 IMPLANT
WIPE NON LINTING 3.25X3.25 (MISCELLANEOUS) ×3 IMPLANT

## 2019-11-01 NOTE — Transfer of Care (Signed)
Immediate Anesthesia Transfer of Care Note  Patient: Sarah Herring  Procedure(s) Performed: CATARACT EXTRACTION PHACO AND INTRAOCULAR LENS PLACEMENT (IOC) RIGHT KAHOOK DUAL BLADE GONIOTOMY (Right Eye)  Patient Location: PACU  Anesthesia Type: MAC  Level of Consciousness: awake, alert  and patient cooperative  Airway and Oxygen Therapy: Patient Spontanous Breathing and Patient connected to supplemental oxygen  Post-op Assessment: Post-op Vital signs reviewed, Patient's Cardiovascular Status Stable, Respiratory Function Stable, Patent Airway and No signs of Nausea or vomiting  Post-op Vital Signs: Reviewed and stable  Complications: No complications documented.

## 2019-11-01 NOTE — Anesthesia Postprocedure Evaluation (Signed)
Anesthesia Post Note  Patient: Sarah Herring  Procedure(s) Performed: CATARACT EXTRACTION PHACO AND INTRAOCULAR LENS PLACEMENT (IOC) RIGHT KAHOOK DUAL BLADE GONIOTOMY (Right Eye)     Patient location during evaluation: PACU Anesthesia Type: MAC Level of consciousness: awake and alert Pain management: pain level controlled Vital Signs Assessment: post-procedure vital signs reviewed and stable Respiratory status: spontaneous breathing, nonlabored ventilation, respiratory function stable and patient connected to nasal cannula oxygen Cardiovascular status: stable and blood pressure returned to baseline Postop Assessment: no apparent nausea or vomiting Anesthetic complications: no   No complications documented.  Trecia Rogers

## 2019-11-01 NOTE — Anesthesia Procedure Notes (Signed)
Procedure Name: MAC Performed by: Izetta Dakin, CRNA Pre-anesthesia Checklist: Timeout performed, Patient being monitored, Suction available, Emergency Drugs available and Patient identified Patient Re-evaluated:Patient Re-evaluated prior to induction Oxygen Delivery Method: Nasal cannula

## 2019-11-01 NOTE — Anesthesia Preprocedure Evaluation (Signed)
Anesthesia Evaluation  Patient identified by MRN, date of birth, ID band Patient awake    Reviewed: Allergy & Precautions, H&P , NPO status , Patient's Chart, lab work & pertinent test results, reviewed documented beta blocker date and time   Airway Mallampati: II  TM Distance: >3 FB Neck ROM: full    Dental no notable dental hx.    Pulmonary asthma , COPD,    Pulmonary exam normal breath sounds clear to auscultation       Cardiovascular Exercise Tolerance: Good hypertension, Atrial Fibrillation + pacemaker  Rhythm:irregular Rate:Normal     Neuro/Psych negative neurological ROS  negative psych ROS   GI/Hepatic negative GI ROS, Neg liver ROS,   Endo/Other  Hypothyroidism   Renal/GU negative Renal ROS  negative genitourinary   Musculoskeletal   Abdominal   Peds  Hematology negative hematology ROS (+)   Anesthesia Other Findings   Reproductive/Obstetrics negative OB ROS                             Anesthesia Physical Anesthesia Plan  ASA: III  Anesthesia Plan: MAC   Post-op Pain Management:    Induction:   PONV Risk Score and Plan:   Airway Management Planned:   Additional Equipment:   Intra-op Plan:   Post-operative Plan:   Informed Consent: I have reviewed the patients History and Physical, chart, labs and discussed the procedure including the risks, benefits and alternatives for the proposed anesthesia with the patient or authorized representative who has indicated his/her understanding and acceptance.     Dental Advisory Given  Plan Discussed with: CRNA  Anesthesia Plan Comments:         Anesthesia Quick Evaluation

## 2019-11-01 NOTE — H&P (Signed)

## 2019-11-01 NOTE — Op Note (Signed)
PREOPERATIVE DIAGNOSIS:  Nuclear sclerotic cataract  right eye. 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 Item Serial No. Manufacturer Lot No. LRB No. Used Action  LENS IOL DIOP 19.0 - [id number] Intraocular Lens LENS IOL DIOP 19.0 [id number] [employer]  Right 1 Implanted    SURGEON:  Deirdre Evener, MD   ANESTHESIA:  Topical with tetracaine drops augmented with 1% preservative-free intracameral lidocaine.    COMPLICATIONS:  None.   DESCRIPTION OF PROCEDURE:  The patient was identified in the holding room and transported to the operating room and placed in the supine position under the operating microscope.  The right eye was identified as the operative eye and it was prepped and draped in the usual sterile ophthalmic fashion.   A 1 millimeter clear-corneal paracentesis was made at the 12:00 position.  0.5 ml of preservative-free 1% lidocaine was injected into the anterior chamber.  The anterior chamber was filled with Viscoat viscoelastic.  A 2.4 millimeter keratome was used to make a near-clear corneal incision at the 9:00 position. The microscope was adjusted and a gonioprism was used to visulaize the trabecular meshwork.  The [facility] Dual Blade was advanced across the anterior chamber under viscoelastic.  The blade was used to mark the trabecular meshwork at the 1:30 position.  The blade was placed two clock hours clockwise into the meshwork.  Proper postioning was confirmed.  The  blade ws passed counterclockwise through the meshwork to excise approximately two to three clock-hours of trabecular meshwork.   A curvilinear capsulorrhexis was made with a cystotome and capsulorrhexis forceps.  Balanced salt solution was used to hydrodissect and hydrodelineate the nucleus.   Phacoemulsification was then used in stop and chop fashion to remove the lens nucleus and epinucleus.  The remaining cortex was then removed using the irrigation and aspiration handpiece. Provisc was then placed into the capsular bag to distend it for lens placement.  A lens was then injected into the capsular bag.  The remaining viscoelastic was aspirated.   Wounds were hydrated with balanced salt solution.  The anterior chamber was inflated to a physiologic pressure with balanced salt solution.  No wound leaks were noted. Cefuroxime 0.1 ml of a 10mg /ml solution was injected into the anterior chamber for a dose of 1 mg of intracameral antibiotic at the completion of the case.  The patient was taken to the recovery room in stable condition without complications of anesthesia or surgery.

## 2019-11-02 ENCOUNTER — Encounter: Payer: Self-pay | Admitting: Ophthalmology

## 2019-11-02 NOTE — Addendum Note (Signed)
Addendum  created 11/02/19 1057 by Ranee Gosselin, MD   Intraprocedure Event edited

## 2020-03-28 ENCOUNTER — Other Ambulatory Visit: Payer: Self-pay | Admitting: Internal Medicine

## 2020-03-28 DIAGNOSIS — R1011 Right upper quadrant pain: Secondary | ICD-10-CM

## 2020-04-25 ENCOUNTER — Other Ambulatory Visit: Payer: Self-pay

## 2020-04-25 ENCOUNTER — Ambulatory Visit
Admission: RE | Admit: 2020-04-25 | Discharge: 2020-04-25 | Disposition: A | Discharge status: Home / Self Care | Payer: Medicare Other | Source: Ambulatory Visit | Attending: Internal Medicine | Admitting: Internal Medicine

## 2020-04-25 DIAGNOSIS — R1011 Right upper quadrant pain: Secondary | ICD-10-CM | POA: Diagnosis present

## 2020-07-09 IMAGING — XA DG C-ARM 61-120 MIN
1 series · 1 of 1 positions shown · non-contrast
Comparison: None.

CLINICAL DATA: 76-year-old female with vertebral augmentation

EXAM:
THORACIC SPINE 2 VIEWS; DG C-ARM 61-120 MIN

[Series 2: ortho standard · 1 of 1 slices shown]
[im 1/1]
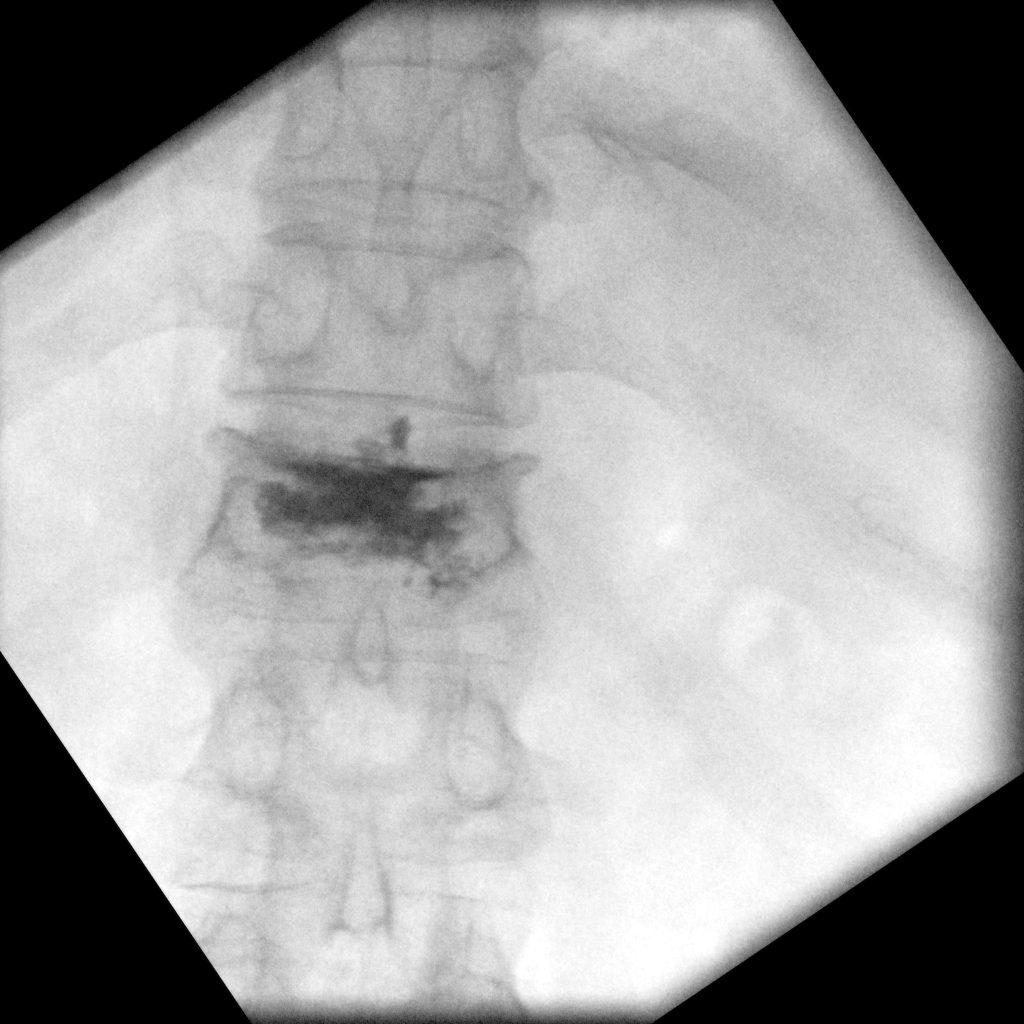

[1 of 1 positions shown; findings below may reference images not displayed]

FINDINGS: Limited intraoperative fluoroscopic spot images of the thoracic
spine demonstrating changes of T12 vertebral augmentation with no
complicating features.
IMPRESSION: Limited fluoroscopic spot images demonstrate T12 vertebral
augmentation with no complicating features. Please refer to the
dictated operative report for full details of intraoperative
findings and procedure.

## 2022-02-24 ENCOUNTER — Other Ambulatory Visit: Payer: Self-pay | Admitting: Orthopedic Surgery

## 2022-02-24 DIAGNOSIS — S22000A Wedge compression fracture of unspecified thoracic vertebra, initial encounter for closed fracture: Secondary | ICD-10-CM

## 2022-03-02 ENCOUNTER — Encounter
Admission: RE | Admit: 2022-03-02 | Discharge: 2022-03-02 | Disposition: A | Discharge status: Home / Self Care | Payer: Medicare PPO | Source: Ambulatory Visit | Attending: Orthopedic Surgery | Admitting: Orthopedic Surgery

## 2022-03-02 DIAGNOSIS — S22000A Wedge compression fracture of unspecified thoracic vertebra, initial encounter for closed fracture: Secondary | ICD-10-CM | POA: Diagnosis not present

## 2022-03-02 MED ORDER — TECHNETIUM TC 99M MEDRONATE IV KIT
20.0000 | PACK | Freq: Once | INTRAVENOUS | Status: AC | PRN
  Administered 2022-03-02: 20.81 via INTRAVENOUS

## 2022-04-24 ENCOUNTER — Other Ambulatory Visit: Payer: Self-pay | Admitting: Ophthalmology

## 2022-04-24 DIAGNOSIS — H547 Unspecified visual loss: Secondary | ICD-10-CM

## 2022-04-30 ENCOUNTER — Ambulatory Visit
Admission: RE | Admit: 2022-04-30 | Discharge: 2022-04-30 | Disposition: A | Discharge status: Home / Self Care | Payer: Medicare PPO | Source: Ambulatory Visit | Attending: Ophthalmology | Admitting: Ophthalmology

## 2022-04-30 DIAGNOSIS — H547 Unspecified visual loss: Secondary | ICD-10-CM | POA: Diagnosis not present

## 2023-11-19 ENCOUNTER — Other Ambulatory Visit: Payer: Self-pay | Admitting: Orthopedic Surgery

## 2023-11-19 DIAGNOSIS — M84362G Stress fracture, left tibia, subsequent encounter for fracture with delayed healing: Secondary | ICD-10-CM

## 2023-12-15 ENCOUNTER — Other Ambulatory Visit

## 2024-01-19 ENCOUNTER — Other Ambulatory Visit: Payer: Self-pay | Admitting: Orthopedic Surgery

## 2024-01-19 DIAGNOSIS — M84362G Stress fracture, left tibia, subsequent encounter for fracture with delayed healing: Secondary | ICD-10-CM

## 2024-02-02 ENCOUNTER — Encounter
Admission: RE | Admit: 2024-02-02 | Discharge: 2024-02-02 | Disposition: A | Discharge status: Home / Self Care | Source: Ambulatory Visit | Attending: Orthopedic Surgery | Admitting: Orthopedic Surgery

## 2024-02-02 DIAGNOSIS — M84362G Stress fracture, left tibia, subsequent encounter for fracture with delayed healing: Secondary | ICD-10-CM | POA: Diagnosis present

## 2024-02-02 MED ORDER — TECHNETIUM TC 99M MEDRONATE IV KIT
20.0000 | PACK | Freq: Once | INTRAVENOUS | Status: AC | PRN
  Administered 2024-02-02: 21.32 via INTRAVENOUS

## 2024-02-03 ENCOUNTER — Encounter

## 2024-02-29 NOTE — Progress Notes (Signed)
 This note has been created using automated tools and reviewed for accuracy by [facility].  Chief Complaint  Patient presents with  . Left Knee - Pain    Subjective  Sarah Herring is a 83 y.o. female who presents for Pain of the Left Knee HPI History of Present Illness Sarah Herring is an 83 year old female with chronic left knee pain who presents for a recheck.  She has chronic left knee pain, primarily on the medial side, which worsens with ambulation. A previous injection did not alleviate the pain. A SPECT scan indicated medial compartment osteoarthritis with increased uptake at the joint line, but no stress fracture was found. She has not yet tried Voltaren gel but has previously used a knee sleeve.  She has a history of multiple allergies, including antibiotics, pain medications, sulfa, statins, and narcotics. She also has a pacemaker and has noticed an elevated heart rate recently. She is uncertain about her current cardiologist but recalls seeing Dr. Hester and receiving treatment from Dr. Larae.  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 She wears long pants to cover her legs and feels she has lost her 'good looks in the legs.'  Review of Systems  Patient Active Problem List  Diagnosis  . Hyperlipemia, mixed  . Glaucoma  . Osteoporosis  . Benign essential hypertension  . Status post ablation of atrial fibrillation  . Compression fracture of T12 vertebra (CMS/HHS-HCC)  . Abnormal EKG  . LBBB (left bundle branch block)  . Cardiac pacemaker in situ  . Pacemaker  . Chronic a-fib (CMS/HHS-HCC)  . Chronic anticoagulation  . Severe mitral regurgitation  . Aortic atherosclerosis  . History of 2019 novel coronavirus disease (COVID-19)  . Prediabetes  . Chronic right-sided low back pain with right-sided sciatica    Outpatient Medications Prior to Visit  Medication Sig Dispense Refill  . acetaminophen  (TYLENOL ) 500 MG tablet Take 1,000 mg by mouth every 8  (eight) hours as needed      . atorvastatin  (LIPITOR) 20 MG tablet Take 1 tablet (20 mg total) by mouth once daily 90 tablet 1  . CALCIUM  CARBONATE/VITAMIN D3 (CALTRATE 600 + D ORAL) Take 1 capsule by mouth as needed    . chlorhexidine  (PERIDEX) 0.12 % solution Swish and spit 15 mLs once daily    . levothyroxine  (SYNTHROID ) 112 MCG tablet TAKE 1 TABLET(112 MCG) BY MOUTH EVERY DAY 30 TO 60 MINUTES BEFORE BREAKFAST ON AN EMPTY STOMACH AND WITH A GLASS OF WATER 90 tablet 1  . metoprolol  TARTrate (LOPRESSOR ) 25 MG tablet TAKE 1 TABLET(25 MG) BY MOUTH TWICE DAILY 180 tablet 3  . MULTIVITAMIN W-MINERALS/LUTEIN (CENTRUM SILVER ORAL) Take 1 capsule by mouth daily with lunch      . omeprazole (PRILOSEC) 20 MG DR capsule TAKE 1 CAPSULE(20 MG) BY MOUTH TWICE DAILY 180 capsule 1  . timoloL  maleate (TIMOPTIC ) 0.5 % ophthalmic solution Place 1 drop into both eyes once daily    . XARELTO 20 mg tablet TAKE 1 TABLET(20 MG) BY MOUTH DAILY 90 tablet 3   No facility-administered medications prior to visit.      Objective  Vitals:   02/29/24 0926  Weight: 72.1 kg (159 lb)  Height: 162.6 cm (5' 4)   Body mass index is 27.29 kg/m.  Home Vitals:     Physical Exam Physical Exam GENERAL: Alert, cooperative, well developed, no acute distress. HEENT: Normocephalic, normal oropharynx, moist mucous membranes. CHEST: Clear to auscultation bilaterally, no wheezes, rhonchi, or crackles. CARDIOVASCULAR:  Normal heart rate and rhythm, S1 and S2 normal without murmurs. ABDOMEN: Soft, non-tender, non-distended, without organomegaly, normal bowel sounds. EXTREMITIES: No cyanosis or edema. MUSCULOSKELETAL: Tenderness at the joint line of the knee, tenderness with valgus stress on the knee, normal range of motion in the knee. NEUROLOGICAL: Cranial nerves grossly intact, moves all extremities without gross motor or sensory deficit.   Results RADIOLOGY Knee SPECT scan: Medial compartment osteoarthritis, no stress  fracture, increased radiotracer uptake at joint line     Assessment/Plan:   Assessment & Plan Left knee osteoarthritis   Chronic left knee pain is attributed to medial compartment osteoarthritis, confirmed by SPECT scan, with no stress fracture present. Pain is localized to the medial joint line with tenderness on valgus stress. Previous injection was ineffective. Differential diagnosis considered tendinitis and vascular issues, but bone scan findings support osteoarthritis as the primary issue. MRI is not feasible due to a pacemaker. Administered 1 cc bimethasone and 2 cc Marcaine  injection into the left knee. Advised using a Tommy Copper knee sleeve for warmth and pain relief, and applying Voltaren gel up to four times daily for pain management. Instructed to monitor for injection reaction for five minutes post-injection and report on injection efficacy in one week. If ineffective, will consider gel injection as the next step.  Recording duration: 9 minutes Diagnoses and all orders for this visit:  Primary localized osteoarthritis of left knee -     BUPivacaine  HCl (MARCAINE ) 0.5 % injection 2 mL -     betamethasone acetate-betamethasone sodium phosphate (CELESTONE) injection 6 mg    This visit was coded based on medical decision making (MDM).           Future Appointments     Date/Time Provider Department Center Visit Type   03/21/2024 8:30 AM Alluri, Keller Grist, MD [facility] C FOLLOW UP   04/04/2024 9:30 AM [facility] LAB   04/11/2024 2:15 PM Hande, Tamra Cal, MD [facility] C PHYSICAL       There are no Patient Instructions on file for this visit.  An after visit summary was provided for the patient either in written format (printed) or through [facility].  This note has been created using automated tools and reviewed for accuracy by [facility].

## 2024-03-05 ENCOUNTER — Emergency Department

## 2024-03-05 ENCOUNTER — Emergency Department
Admission: EM | Admit: 2024-03-05 | Discharge: 2024-03-05 | Disposition: A | Discharge status: Home / Self Care | Attending: Emergency Medicine | Admitting: Emergency Medicine

## 2024-03-05 ENCOUNTER — Other Ambulatory Visit: Payer: Self-pay

## 2024-03-05 ENCOUNTER — Encounter: Payer: Self-pay | Admitting: Emergency Medicine

## 2024-03-05 DIAGNOSIS — J45909 Unspecified asthma, uncomplicated: Secondary | ICD-10-CM | POA: Diagnosis not present

## 2024-03-05 DIAGNOSIS — Z950 Presence of cardiac pacemaker: Secondary | ICD-10-CM | POA: Insufficient documentation

## 2024-03-05 DIAGNOSIS — X501XXA Overexertion from prolonged static or awkward postures, initial encounter: Secondary | ICD-10-CM | POA: Insufficient documentation

## 2024-03-05 DIAGNOSIS — I10 Essential (primary) hypertension: Secondary | ICD-10-CM | POA: Diagnosis not present

## 2024-03-05 DIAGNOSIS — M549 Dorsalgia, unspecified: Secondary | ICD-10-CM | POA: Diagnosis present

## 2024-03-05 DIAGNOSIS — S32049A Unspecified fracture of fourth lumbar vertebra, initial encounter for closed fracture: Secondary | ICD-10-CM | POA: Insufficient documentation

## 2024-03-05 MED ORDER — ACETAMINOPHEN 325 MG PO TABS
650.0000 mg | ORAL_TABLET | Freq: Once | ORAL | Status: AC
  Administered 2024-03-05: 650 mg via ORAL
  Filled 2024-03-05: qty 2

## 2024-03-05 MED ORDER — LIDOCAINE 5 % EX PTCH
1.0000 | MEDICATED_PATCH | CUTANEOUS | Status: DC
  Administered 2024-03-05: 1 via TRANSDERMAL
  Filled 2024-03-05: qty 1

## 2024-03-05 NOTE — Progress Notes (Signed)
 Orthopedic Tech Progress Note Patient Details:  Sarah Herring 09-08-1940 [id number] LSO brace has been ordered from [facility].  Patient ID: Sarah Herring, female   DOB: [birth year], 83 y.o.   MRN: [id number]  Sarah Herring Bar 03/05/2024, 3:13 PM

## 2024-03-05 NOTE — Discharge Instructions (Addendum)
 You were seen in the emergency department for a fracture of one of the bones in your spine in the lumbar region.  Please take Tylenol  or 1000 mg every 6 hours as needed for pain.  Please follow-up with the neurosurgeon listed in this paperwork.  Please also follow-up with your primary care provider following today's visit.  Please wear the brace entire time until you can follow-up with neurosurgery.  Return to the emergency department if you experience numbness or weakness in your legs, bowel or bladder incontinence or numbness, fever, chills, vomiting, abdominal pain, urinary retention or any other new, concerning, or worsening symptoms.

## 2024-03-05 NOTE — ED Provider Notes (Signed)
 [facility] Provider Note    Event Date/Time   First MD Initiated Contact with Patient 03/05/24 1223     (approximate)   History   Back Pain   HPI  Sarah Herring is a 83 y.o. female  with a past medical history of HTN, osteoporosis, asthma, GERD, migraines, a-fib, pacemaker presents to the emergency department with left-sided and middle lower back pain that started after she attempted to pick her husband up last night from the floor when he fell backwards.  Patient states she was sitting on a wooden chest when she reached down to help him up and heard a pop in her back. Reports increased pain with twisting and laying down is uncomfortable at this time. She reports she had 1 episode of pain this morning shooting down her right leg, but this has went away.  Patient states this back pain was not present prior to yesterday's injury.  She denies chest pain, shortness of breath, abdominal pain, vomiting, dysuria, saddle anesthesia, bowel or bladder incontinence, numbness or weakness in her legs, fever, IVDU. She does not smoke. She has had one 500 mg Tylenol  around 8:30 this morning. Last BM yesterday morning.  Patient does take Eliquis  and does have a pacemaker. Reports prior kyphoplasty at T12 and T5.  Physical Exam   Triage Vital Signs: ED Triage Vitals  Encounter Vitals Group     BP 03/05/24 1155 (!) 152/81     Girls Systolic BP Percentile --      Girls Diastolic BP Percentile --      Boys Systolic BP Percentile --      Boys Diastolic BP Percentile --      Pulse Rate 03/05/24 1155 82     Resp 03/05/24 1155 17     Temp 03/05/24 1155 97.7 F (36.5 C)     Temp Source 03/05/24 1155 Oral     SpO2 03/05/24 1155 100 %     Weight 03/05/24 1158 143 lb (64.9 kg)     Height 03/05/24 1158 5' 4 (1.626 m)     Head Circumference --      Peak Flow --      Pain Score 03/05/24 1158 9     Pain Loc --      Pain Education --      Exclude from Growth Chart --      Most recent vital signs: Vitals:   03/05/24 1155  BP: (!) 152/81  Pulse: 82  Resp: 17  Temp: 97.7 F (36.5 C)  SpO2: 100%   General: Well-appearing, in no acute distress. Appears stated age. Head: Normocephalic, atraumatic. CV: Regular rate, 82 bpm. Dorsalis pedis and PT pulses 2+ bilaterally.  Respiratory: Breath sounds clear b/l. No wheezes, rales, or rhonchi. No respiratory distress. Normal respiratory effort. Skin:Warm, dry, intact. No rashes, lesions, or ecchymosis. No cyanosis or pallor. Neurological: A&Ox4 to person, place, time, and situation. Sensation intact and equal to L4, L5, and S1.  GI: Soft, non-distended, non-tender. No rebound or guarding.  MSK: Midline lumbar spinal tenderness and left lumbar paraspinal tenderness. Normal ROM with knee extension, dorsiflexion, and plantarflexion and 5/5 strength in bilateral lower extremities. No swelling or obvious deformities.  Straight leg raise not performed due to patient's pain with lying down. No CVA tenderness bilaterally.   ED Results / Procedures / Treatments   Labs (all labs ordered are listed, but only abnormal results are displayed) Labs Reviewed - No data to display   EKG  RADIOLOGY CT lumbar spine ordered.   IMPRESSION: 1. Incompletely healed inferior endplate fracture at L4 with 40% anterior height loss and minimal retropulsion. 2. Stable remote left superior endplate fracture at L2. 3. Stable T12 vertebral augmentation.   PROCEDURES:  Critical Care performed: No   Procedures   MEDICATIONS ORDERED IN ED: Medications  lidocaine  (LIDODERM ) 5 % 1 patch (1 patch Transdermal Patch Applied 03/05/24 1343)  acetaminophen  (TYLENOL ) tablet 650 mg (650 mg Oral Given 03/05/24 1342)     IMPRESSION / MDM / ASSESSMENT AND PLAN / ED COURSE  I reviewed the triage vital signs and the nursing notes.                              Differential diagnosis includes, but is not limited to, L4 endplate  fracture, msk strain, compression fracture, spinal cord injury  Patient's presentation is most consistent with acute complicated illness / injury requiring diagnostic workup.  Patient is here with signs and symptoms as described above.  Her pain started after an injury that occurred last night; she did not have any back pain prior to the injury.  She has no red flag symptoms of back pain.  No chest pain or shortness of breath.  Well-appearing on my exam.  Could not evaluate with a straight leg raise due to increased pain with laying down. Pain is reproducible with palpation and lumbar flexion and twisting motions.  Her blood pressure is mildly elevated with known hypertension.  She has already taken 1 dose of Tylenol  this morning ~4.5 hours ago, so I did give her 650 mg Tylenol  here with a lidocaine  patch.  CT lumbar spine ordered.   CT lumbar: Incompletely healed inferior endplate fracture at L4 with 40% anterior height loss and minimal retropulsion.    Unsure if this fracture is new or old--last MRI spine imaging completed in 2019 with no prior L4 fracture. Neurosurgery Dr. Reeves Daisy consulted.  He stated patient is okay for outpatient follow-up with him after LSO brace applied in the ED. Patient has Tylenol  at home--discussed dosing with her. Patient has daughter and sons who can help her at home with ADLs. Will also have her f/u with her PCP following today's visit.  The patient may return to the emergency department for any new, worsening, or concerning symptoms. Patient was given the opportunity to ask questions; all questions were answered. Emergency department return precautions were provided for the patient.  Patient is in agreement to the treatment plan.  Patient is stable for discharge.   FINAL CLINICAL IMPRESSION(S) / ED DIAGNOSES   Final diagnoses:  Closed fracture of fourth lumbar vertebra, unspecified fracture morphology, initial encounter (HCC)     Rx / DC Orders   ED  Discharge Orders     None        Note:  This document was prepared using Dragon voice recognition software and may include unintentional dictation errors.     Sheron Salm, PA-C 03/05/24 1522    Dorothyann Drivers, MD 03/05/24 [phone]

## 2024-03-05 NOTE — ED Notes (Signed)
 See triage note  Presents with pain to lower back  States she bent to help her husband up after he had fallen  Pain this am is moving into right leg  Denies any falls

## 2024-03-05 NOTE — ED Triage Notes (Signed)
 Pt c/o lower back pain that started after she picked her husband up last night when he fell. Pt c/o pain across her lower back and reports an episode this morning of pain shooting down her right leg. Pt reports pain is worse when she bends.

## 2024-03-06 ENCOUNTER — Telehealth: Payer: Self-pay | Admitting: Neurosurgery

## 2024-03-06 NOTE — Telephone Encounter (Signed)
 I spoke to Sarah Herring and informed her that we have added her to the cancellation list. She will need to call her PCP to get pain medication in the meantime as she has not seen our providers before. Patient voiced understanding.

## 2024-03-06 NOTE — Telephone Encounter (Signed)
 Pt was in the hospital for a Closed fracture of fourth lumbar vertebra, unspecified fracture morphology, initial encounter [facility]) & is requesting a Dr. Cooper. She also wanted me to put the appt in just incase, but she is wondering if its possible to get in with Dr.Y or Smith instead. She is unable to walk alone or stand/sit.

## 2024-03-27 NOTE — Progress Notes (Unsigned)
 Referring Physician:  Sadie Manna, MD [address] [facility] [place],  [place] [address]  Primary Physician:  Sadie Manna, MD  History of Present Illness: 03/29/2024 [name] is here today with a chief complaint of L4 fracture believed to be suffered approximately 3 weeks ago when she heard a pop in her low back.  She also has a history of T5 and T12 fractures that were treated with kyphoplasty.  Currently she has middle and left-sided low back pain causing intermittent shooting pain into the bilateral lower extremities, left worse than right.  She continues to walk, but feels as though she is slower than normal and at times her brace makes her more uncomfortable.  Standing for a prolonged period of time exacerbates her pain.   Conservative measures:  Physical therapy:  Has not participated in  Multimodal medical therapy including regular antiinflammatories:  Tylenol   Injections:  epidural steroid injections  Past Surgery: kyphoplasty at T12 and T5    The symptoms are causing a significant impact on the patient's life.   Review of Systems:  A 10 point review of systems is negative, except for the pertinent positives and negatives detailed in the HPI.  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   Medtronic [id number]   Vertigo    when not taking metoprolol     Past Surgical History: Past Surgical History:  Procedure Laterality Date   ABLATION     CARDIOVERSION N/A 05/21/2016   Procedure: CARDIOVERSION;  Surgeon: Wolm JINNY Rhyme, MD;  Location: [facility];  Service: Cardiovascular;   Laterality: N/A;   CATARACT EXTRACTION W/PHACO Left 10/11/2019   Procedure: CATARACT EXTRACTION PHACO AND INTRAOCULAR LENS PLACEMENT (IOC) LEFT 8.00 01:04.2 12.4%;  Surgeon: Mittie Gaskin, MD;  Location: [facility];  Service: Ophthalmology;  Laterality: Left;   CATARACT EXTRACTION W/PHACO Right 11/01/2019   Procedure: CATARACT EXTRACTION PHACO AND INTRAOCULAR LENS PLACEMENT (IOC) RIGHT KAHOOK DUAL BLADE GONIOTOMY;  Surgeon: Mittie Gaskin, MD;  Location: [facility];  Service: Ophthalmology;  Laterality: Right;  10.69 1:11 15.1%   COLONOSCOPY     KYPHOPLASTY N/A 12/30/2017   Procedure: [id number];  Surgeon: Kathlynn Sharper, MD;  Location: [facility];  Service: Orthopedics;  Laterality: N/A;    Allergies: Allergies as of 03/29/2024 - Review Complete 03/29/2024  Allergen Reaction Noted   Amoxicillin-pot clavulanate  01/27/2022   Azopt [brinzolamide]  10/03/2019   Betoptic [betaxolol]  10/03/2019   Brimonidine  10/03/2019   Cardizem  [diltiazem ]  10/03/2019   Combigan [brimonidine tartrate-timolol ]  10/03/2019   Dust mite extract Other (See Comments) 10/01/2023   Elemental sulfur Itching and Swelling 05/19/2016   Metronidazole  01/27/2022   Tramadol  01/27/2022   Travatan [travoprost]  10/03/2019   Trusopt [dorzolamide]  10/03/2019   Hydrocodone  Nausea And Vomiting 03/27/2022   Oxycodone Nausea And Vomiting 03/27/2022    Medications: Outpatient Encounter Medications as of 03/29/2024  Medication Sig   acetaminophen  (TYLENOL ) 500 MG tablet Take 1,000 mg by mouth every 6 (six) hours as needed for moderate pain or headache.  Ascorbic Acid (VITAMIN C) 1000 MG tablet Take 1,000 mg by mouth daily.   atorvastatin  (LIPITOR) 20 MG tablet Take 20 mg by mouth daily.   Calcium  Carb-Cholecalciferol (CALCIUM  600/VITAMIN D3 PO) Take 1 tablet by mouth daily.   Cholecalciferol (VITAMIN D ) 2000 units CAPS Take 5,000 Units by mouth daily.    fexofenadine (ALLEGRA) 180 MG  tablet Take 180 mg by mouth daily.   levothyroxine  (SYNTHROID , LEVOTHROID) 112 MCG tablet Take 112 mcg by mouth daily before breakfast.   metoprolol  tartrate (LOPRESSOR ) 50 MG tablet Take 25 mg by mouth 2 (two) times a day.    Multiple Vitamin (MULTIVITAMIN WITH MINERALS) TABS tablet Take 1 tablet by mouth daily.   omeprazole (PRILOSEC) 20 MG capsule Take 20 mg by mouth daily.   Polyvinyl Alcohol -Povidone PF (REFRESH) 1.4-0.6 % SOLN Place 1 drop into both eyes 2 (two) times daily as needed (for dry eyes).   Potassium 99 MG TABS Take by mouth daily.   timolol  (TIMOPTIC ) 0.5 % ophthalmic solution 1 drop daily.   XARELTO 20 MG TABS tablet Take 20 mg by mouth.   [DISCONTINUED] apixaban  (ELIQUIS ) 5 MG TABS tablet Take 5 mg by mouth 2 (two) times daily.   [DISCONTINUED] Ferrous Sulfate (IRON) 325 (65 Fe) MG TABS Take by mouth daily. (Patient not taking: Reported on 03/29/2024)   [DISCONTINUED] furosemide  (LASIX ) 40 MG tablet Take 40 mg by mouth daily. As needed for edema   [DISCONTINUED] latanoprost  (XALATAN ) 0.005 % ophthalmic solution Place 1 drop into both eyes at bedtime.   [DISCONTINUED] magnesium  oxide (MAG-OX) 400 MG tablet Take 400 mg by mouth 2 (two) times daily.    [DISCONTINUED] Zinc 50 MG TABS Take by mouth daily.   No facility-administered encounter medications on file as of 03/29/2024.    Social History: Social History[1]  Family Medical History: Family History  Problem Relation Age of Onset   Heart failure Father     Physical Examination: @VITALWITHPAIN @  General: Patient is well developed, well nourished, calm, collected, and in no apparent distress. Attention to examination is appropriate.  Psychiatric: Patient is non-anxious.  Head:  Pupils equal, round, and reactive to light.  ENT:  Oral mucosa appears well hydrated.  Neck:   Supple.  Full range of motion.  Respiratory: Patient is breathing without any difficulty.  Extremities: No edema.  Vascular: Palpable  dorsal pedal pulses.  Skin:   On exposed skin, there are no abnormal skin lesions.  NEUROLOGICAL:     Awake, alert, oriented to person, place, and time.  Speech is clear and fluent. Fund of knowledge is appropriate.   Cranial Nerves: Pupils equal round and reactive to light.  Facial tone is symmetric.   ROM of spine: Some tenderness to palpation of lumbar spine.  Strength:  At least a 4+/5 in bilateral lower extremities.  Medical Decision Making  Imaging: EXAM: CT OF THE LUMBAR SPINE WITHOUT CONTRAST 03/05/2024 01:40:13 PM   TECHNIQUE: CT of the lumbar spine was performed without the administration of intravenous contrast. Multiplanar reformatted images are provided for review. Automated exposure control, iterative reconstruction, and/or weight based adjustment of the mA/kV was utilized to reduce the radiation dose to as low as reasonably achievable.   COMPARISON: Report of MR lumbar spine without and with contrast 12/22/2017. Images are not currently available. CT of the abdomen and pelvis dated 06/02/2018.   CLINICAL HISTORY: Low back pain, increased fracture risk.   FINDINGS:   BONES AND ALIGNMENT: Inferior endplate fracture at L4 appears incompletely  healed. 40% loss of height is present anteriorly. Minimally retropulsed bone is present along the inferior endplate. Remote left superior endplate fracture at L2 is stable. Spinal augmentation at T12 is stable. Mild straightening of the normal lumbar lordosis is present. No suspicious bone lesion.   DEGENERATIVE CHANGES: Mild disc bulging and facet hypertrophy is present at L3-L4 without focal stenosis. Broad-based disc protrusion and mild facet hypertrophy is present at L4-L5. Mild foraminal narrowing is worse on the right.   SOFT TISSUES: Atherosclerotic calcifications are present in the aorta and branch vessels without aneurysm. No acute abnormality.   IMPRESSION: 1. Incompletely healed inferior endplate  fracture at L4 with 40% anterior height loss and minimal retropulsion. 2. Stable remote left superior endplate fracture at L2. 3. Stable T12 vertebral augmentation.    I have personally reviewed the images and agree with the above interpretation.  Assessment and Plan: Ms. Rankin is a pleasant 83 y.o. female with inferior endplate fracture at L4 with approximately 40 to 50% anterior height loss suffered approximately 3 weeks ago.  She continues to be in pain primarily with movement and prolonged standing.  She is not wearing her brace intermittently.  She has known osteoporosis, no longer undergoing treatment.  Has a history of 2 remote compression fractures that were treated with kyphoplasty in the past.  Plan includes the following to move forward:  -Referral placed for kyphoplasty - Hydrocodone  given for pain - Referral sent to osteoporosis management clinic. - Plan to see back in approximately 4 weeks or as needed  Thank you for involving me in the care of this patient.     Lyle Decamp, PA-C Dept. of Neurosurgery     [1]  Social History Tobacco Use   Smoking status: Never   Smokeless tobacco: Never  Vaping Use   Vaping status: Never Used  Substance Use Topics   Alcohol  use: Never   Drug use: No

## 2024-03-28 ENCOUNTER — Inpatient Hospital Stay
Admission: RE | Admit: 2024-03-28 | Discharge: 2024-03-28 | Disposition: A | Payer: Self-pay | Source: Ambulatory Visit | Attending: Physician Assistant | Admitting: Physician Assistant

## 2024-03-28 ENCOUNTER — Other Ambulatory Visit: Payer: Self-pay

## 2024-03-28 DIAGNOSIS — Z049 Encounter for examination and observation for unspecified reason: Secondary | ICD-10-CM

## 2024-03-29 ENCOUNTER — Ambulatory Visit: Admitting: Physician Assistant

## 2024-03-29 ENCOUNTER — Encounter: Payer: Self-pay | Admitting: Physician Assistant

## 2024-03-29 VITALS — BP 134/78 | Ht 64.0 in | Wt 143.0 lb

## 2024-03-29 DIAGNOSIS — S32049D Unspecified fracture of fourth lumbar vertebra, subsequent encounter for fracture with routine healing: Secondary | ICD-10-CM

## 2024-03-29 DIAGNOSIS — M8000XA Age-related osteoporosis with current pathological fracture, unspecified site, initial encounter for fracture: Secondary | ICD-10-CM

## 2024-03-29 DIAGNOSIS — S32048A Other fracture of fourth lumbar vertebra, initial encounter for closed fracture: Secondary | ICD-10-CM | POA: Diagnosis not present

## 2024-03-29 MED ORDER — HYDROCODONE-ACETAMINOPHEN 5-325 MG PO TABS: 1.0000 | ORAL_TABLET | Freq: Four times a day (QID) | ORAL | 0 refills | Status: DC | PRN

## 2024-03-30 ENCOUNTER — Telehealth: Payer: Self-pay | Admitting: Physician Assistant

## 2024-03-30 ENCOUNTER — Other Ambulatory Visit: Payer: Self-pay | Admitting: Physician Assistant

## 2024-03-30 MED ORDER — ONDANSETRON HCL 4 MG PO TABS: 4.0000 mg | ORAL_TABLET | Freq: Three times a day (TID) | ORAL | 1 refills | Status: AC | PRN

## 2024-03-30 NOTE — Telephone Encounter (Signed)
 Patient notified

## 2024-03-30 NOTE — Telephone Encounter (Signed)
 Pt called into office and says that she needs anti-nausea medication to be called in with her pain prescription to [facility] on [address]. SABRA

## 2024-04-11 ENCOUNTER — Encounter: Payer: Self-pay | Admitting: Orthopedic Surgery

## 2024-04-11 NOTE — Progress Notes (Signed)
 Received medication therapy recommendation from her insurance recommending she be started on medication for osteoporosis.   Per Sarah Herring's note, she has referred her to osteoporosis clinic and they will discuss further osteoporosis treatment with her.

## 2024-04-21 ENCOUNTER — Telehealth: Payer: Self-pay

## 2024-04-21 ENCOUNTER — Telehealth: Payer: Self-pay | Admitting: Interventional Radiology

## 2024-04-21 ENCOUNTER — Encounter: Payer: Self-pay | Admitting: Physician Assistant

## 2024-04-21 DIAGNOSIS — S32040A Wedge compression fracture of fourth lumbar vertebra, initial encounter for closed fracture: Secondary | ICD-10-CM

## 2024-04-21 DIAGNOSIS — M8000XA Age-related osteoporosis with current pathological fracture, unspecified site, initial encounter for fracture: Secondary | ICD-10-CM

## 2024-04-21 DIAGNOSIS — S32049D Unspecified fracture of fourth lumbar vertebra, subsequent encounter for fracture with routine healing: Secondary | ICD-10-CM

## 2024-04-21 NOTE — Telephone Encounter (Signed)
 Sarah Herring from [facility] called, patient was referred to them for Kypho and Dr Karalee states patient had Nuclear Medication scan on 02/02/2024-did not show fracture and then had CT lumbar scan done that showed fracture, Dr Karalee states before they see patient for Kypho they need her to have another NM scan done. We need to order it.  Also, then need to put another order in for Kypho when complete-they do not see referrals needs to be placed as an order: IR rad evaluation and they will see it.  I placed the order for the scan for the patient and patient advised

## 2024-04-21 NOTE — Telephone Encounter (Signed)
 tt Rolan at [facility] [facility] office. Dr Karalee wants another NM bone scan done. Rolan will let Lyle know and put a new order in for consult when completed

## 2024-04-24 ENCOUNTER — Telehealth: Payer: Self-pay | Admitting: Physician Assistant

## 2024-04-24 NOTE — Telephone Encounter (Signed)
 I spoke to the patient. She took hydrocodone  and her pain is a 3-4. She feels the pain radiates across her back. The pain is similar to the pain she had before her T12 kypho. Since she is scheudled for NM bone w/ spect CT tomorrow she wants to make sure that the scan will be of her thoracic and lumbar area. She denies leg pain, bowel or bladder incontinence.

## 2024-04-24 NOTE — Telephone Encounter (Signed)
 Patient is calling to let our office know that she took another fall on Saturday. She states that her pain is an 8-9/10 and that it starts from the middle of her back. She would like to know what she needs to do. Please advise.

## 2024-04-24 NOTE — Telephone Encounter (Signed)
 Patient is aware to keep her CT appt tomorrow and the our office will contact her once we receive the results.

## 2024-04-25 ENCOUNTER — Ambulatory Visit
Admission: RE | Admit: 2024-04-25 | Discharge: 2024-04-25 | Attending: Physician Assistant | Admitting: Physician Assistant

## 2024-04-25 ENCOUNTER — Ambulatory Visit
Admission: RE | Admit: 2024-04-25 | Discharge: 2024-04-25 | Disposition: A | Discharge status: Home / Self Care | Source: Ambulatory Visit | Attending: Physician Assistant | Admitting: Physician Assistant

## 2024-04-25 ENCOUNTER — Other Ambulatory Visit: Payer: Self-pay | Admitting: Physician Assistant

## 2024-04-25 DIAGNOSIS — M8000XA Age-related osteoporosis with current pathological fracture, unspecified site, initial encounter for fracture: Secondary | ICD-10-CM | POA: Insufficient documentation

## 2024-04-25 DIAGNOSIS — S32040A Wedge compression fracture of fourth lumbar vertebra, initial encounter for closed fracture: Secondary | ICD-10-CM

## 2024-04-25 DIAGNOSIS — S32049D Unspecified fracture of fourth lumbar vertebra, subsequent encounter for fracture with routine healing: Secondary | ICD-10-CM | POA: Insufficient documentation

## 2024-04-25 MED ORDER — TECHNETIUM TC 99M MEDRONATE IV KIT
20.0000 | PACK | Freq: Once | INTRAVENOUS | Status: AC | PRN
  Administered 2024-04-25: 21.69 via INTRAVENOUS

## 2024-04-27 ENCOUNTER — Inpatient Hospital Stay
Admission: RE | Admit: 2024-04-27 | Discharge: 2024-04-27 | Attending: Physician Assistant | Admitting: Physician Assistant

## 2024-04-27 DIAGNOSIS — S32040A Wedge compression fracture of fourth lumbar vertebra, initial encounter for closed fracture: Secondary | ICD-10-CM

## 2024-04-27 HISTORY — PX: IR RADIOLOGIST EVAL & MGMT: IMG5224

## 2024-04-27 NOTE — Consult Note (Signed)
 "      Chief Complaint: Patient was seen in consultation today for L4 compression fracture at the request of Frisbie,Brooke  Referring Physician(s): Frisbie,Brooke  History of Present Illness: Sarah Herring is a 84 y.o. female with a known history of osteoporosis and prior age-related osteoporotic compression fractures which were previously treated with cement augmentation in 2019.  Unfortunately, she developed severe low back pain in November 2025.  A CT scan performed on 03/05/2024 confirmed an L4 compression fracture.  She was doing relatively well with conservative therapy until earlier this month when she tripped over her husband's cane and suffered an additional fall.  A nuclear medicine bone scan with SPECT-CT was performed on 04/25/2024 and demonstrates focal activity within the L4 vertebral body combined with progressive height loss compared to prior consistent with a subacute but unhealed and progressive compression fracture.  She does report continued low back pain particularly when she is standing for longer periods of time.  She rates her pain a 7 out of 10 and notes that it causes significant disability.  Her Marzetta Morris disability questionnaire scored a 22 out of 24.  Importantly, she is planning on visiting Dr. Damian at the osteoporosis clinic in order to get on anabolic therapy to strengthen her bones.  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   Medtronic [id number]   Vertigo    when not taking metoprolol     Past Surgical History:  Procedure Laterality Date   ABLATION     CARDIOVERSION N/A 05/21/2016   Procedure: CARDIOVERSION;  Surgeon:  Wolm JINNY Rhyme, MD;  Location: [facility];  Service: Cardiovascular;  Laterality: N/A;   CATARACT EXTRACTION W/PHACO Left 10/11/2019   Procedure: CATARACT EXTRACTION PHACO AND INTRAOCULAR LENS PLACEMENT (IOC) LEFT 8.00 01:04.2 12.4%;  Surgeon: Mittie Gaskin, MD;  Location: [facility];  Service: Ophthalmology;  Laterality: Left;   CATARACT EXTRACTION W/PHACO Right 11/01/2019   Procedure: CATARACT EXTRACTION PHACO AND INTRAOCULAR LENS PLACEMENT (IOC) RIGHT KAHOOK DUAL BLADE GONIOTOMY;  Surgeon: Mittie Gaskin, MD;  Location: [facility];  Service: Ophthalmology;  Laterality: Right;  10.69 1:11 15.1%   COLONOSCOPY     IR RADIOLOGIST EVAL & MGMT  04/27/2024   KYPHOPLASTY N/A 12/30/2017   Procedure: [id number];  Surgeon: Kathlynn Sharper, MD;  Location: [facility] OR[facility];  Service: Orthopedics;  Laterality: N/A;    Allergies: Amoxicillin-pot clavulanate, Azopt [brinzolamide], Betoptic [betaxolol], Brimonidine, Cardizem  [diltiazem ], Combigan [brimonidine tartrate-timolol ], Dust mite extract, Elemental sulfur, Metronidazole, Tramadol, Travatan [travoprost], Trusopt [dorzolamide], Hydrocodone , and Oxycodone  Medications: Prior to Admission medications  Medication Sig Start Date End Date Taking? Authorizing Provider  Ascorbic Acid (VITAMIN C) 1000 MG tablet Take 1,000 mg by mouth daily.   Yes [provider]  atorvastatin  (LIPITOR) 20 MG tablet Take 20 mg by mouth daily.   Yes [provider]  Calcium  Carb-Cholecalciferol (CALCIUM  600/VITAMIN D3 PO) Take 1 tablet by mouth daily.   Yes [provider]  Cholecalciferol (VITAMIN D ) 2000 units CAPS Take 5,000 Units by mouth daily.    Yes [provider]  fexofenadine (ALLEGRA) 180 MG tablet Take  180 mg by mouth daily.   Yes [provider]  HYDROcodone -acetaminophen  (NORCO/VICODIN) 5-325 MG tablet Take 1 tablet by mouth every 6 (six) hours as needed for moderate pain (pain score 4-6).  03/29/24  Yes Ulis Bottcher, PA-C  levothyroxine  (SYNTHROID , LEVOTHROID) 112 MCG tablet Take 112 mcg by mouth daily before breakfast.   Yes [provider]  Multiple Vitamin (MULTIVITAMIN WITH MINERALS) TABS tablet Take 1 tablet by mouth daily.   Yes [provider]  omeprazole (PRILOSEC) 20 MG capsule Take 20 mg by mouth daily.   Yes [provider]  ondansetron  (ZOFRAN ) 4 MG tablet Take 1 tablet (4 mg total) by mouth every 8 (eight) hours as needed for nausea or vomiting. 03/30/24 03/30/25 Yes Ulis Bottcher, PA-C  Polyvinyl Alcohol -Povidone PF (REFRESH) 1.4-0.6 % SOLN Place 1 drop into both eyes 2 (two) times daily as needed (for dry eyes).   Yes [provider]  Potassium 99 MG TABS Take by mouth daily.   Yes [provider]  timolol  (TIMOPTIC ) 0.5 % ophthalmic solution 1 drop daily.   Yes [provider]  XARELTO 20 MG TABS tablet Take 20 mg by mouth. 08/18/21  Yes [provider]  metoprolol  tartrate (LOPRESSOR ) 50 MG tablet Take 25 mg by mouth 2 (two) times a day.  06/27/18 03/29/24  [provider]     Family History  Problem Relation Age of Onset   Heart failure Father     Social History   Socioeconomic History   Marital status: Married    Spouse name: Not on file   Number of children: Not on file   Years of education: Not on file   Highest education level: Not on file  Occupational History   Not on file  Tobacco Use   Smoking status: Never   Smokeless tobacco: Never  Vaping Use   Vaping status: Never Used  Substance and Sexual Activity   Alcohol  use: Never   Drug use: No   Sexual activity: Not Currently  Other Topics Concern   Not on file  Social History Narrative   Not on file   Social Drivers of Health   Tobacco Use: Low Risk  (04/19/2024)   Received from [facility]   Patient History    Smoking Tobacco Use: Never    Smokeless Tobacco Use: Never    Passive Exposure: Not  on file  Financial Resource Strain: Low Risk  (04/19/2024)   Received from [facility]   Overall Financial Resource Strain (CARDIA)    Difficulty of Paying Living Expenses: Not hard at all  Food Insecurity: No Food Insecurity (04/19/2024)   Received from [facility]   Epic    Within the past 12 months, you worried that your food would run out before you got the money to buy more.: Never true    Within the past 12 months, the food you bought just didn't last and you didn't have money to get more.: Never true  Transportation Needs: No Transportation Needs (04/19/2024)   Received from [facility] - Transportation    In the past 12 months, has lack of transportation kept you from medical appointments or from getting medications?: No    Lack of Transportation (Non-Medical): No  Physical Activity: Not on file  Stress: Not on file  Social Connections: Not on file  Depression (EYV7-0): Not on file  Alcohol  Screen: Not on file  Housing: Low Risk  (  04/19/2024)   Received from [facility]   Epic    In the last 12 months, was there a time when you were not able to pay the mortgage or rent on time?: No    In the past 12 months, how many times have you moved where you were living?: 0    At any time in the past 12 months, were you homeless or living in a shelter (including now)?: No  Utilities: Not At Risk (04/19/2024)   Received from [facility]   Epic    In the past 12 months has the electric, gas, oil, or water company threatened to shut off services in your home?: No  Health Literacy: Not on file    Review of Systems: A 12 point ROS discussed and pertinent positives are indicated in the HPI above.  All other systems are negative.  Review of Systems  Vital Signs: BP 118/74 (BP Location: Left Arm, Patient Position: Sitting, Cuff Size: Normal)   Pulse 75   Temp 97.8 F (36.6 C) (Oral)   Resp 16   Wt  57.6 kg   SpO2 99%   BMI 21.80 kg/m   Advance Care Plan: The advanced care plan/surrogate decision maker was discussed at the time of visit and the patient did not wish to discuss or was not able to name a surrogate decision maker or provide an advance care plan.    Physical Exam Constitutional:      General: She is not in acute distress.    Appearance: Normal appearance. She is normal weight.  HENT:     Head: Normocephalic and atraumatic.  Eyes:     General: No scleral icterus. Cardiovascular:     Rate and Rhythm: Normal rate.  Pulmonary:     Effort: Pulmonary effort is normal.  Abdominal:     General: There is no distension.     Tenderness: There is no abdominal tenderness.  Musculoskeletal:       Back:     Comments: TTP at the level of L4 and in the adjacent and superior paraspinal muscles.  No TTP along the thoracic spine or ribs.   Skin:    General: Skin is warm and dry.  Neurological:     Mental Status: She is alert and oriented to person, place, and time.  Psychiatric:        Mood and Affect: Mood normal.        Behavior: Behavior normal.       Imaging: IR Radiologist Eval & Mgmt Result Date: 04/27/2024 EXAM: NEW PATIENT OFFICE VISIT CHIEF COMPLAINT: SEE NOTE IN EPIC HISTORY OF PRESENT ILLNESS: SEE NOTE IN EPIC REVIEW OF SYSTEMS: SEE NOTE IN EPIC PHYSICAL EXAMINATION: SEE NOTE IN EPIC ASSESSMENT AND PLAN: SEE NOTE IN EPIC Electronically Signed   By: Wilkie Lent M.D.   On: 04/27/2024 10:15   NM Bone W/Spect CT Result Date: 04/27/2024 CLINICAL DATA:  84 year old female with probable L4 compression fracture. EXAM: NM BONE SCAN AND SPECT IMAGING TECHNIQUE: After intravenous injection of radiopharmaceutical, delayed planar images were obtained in multiple projections. Additionally, delayed triplanar SPECT images were obtained through the area of interest. RADIOPHARMACEUTICALS:  21.69 mCi Tc-64m MDP COMPARISON:  CT scan of the lumbar spine dated 03/05/2024 FINDINGS:  Focal radiotracer uptake is present within the L4 vertebral body consistent with a non healed subacute compression fracture. There has been significant interval height loss compared to the prior imaging. Height loss is now approximately 50%  compared to 25% previously. No significant retropulsion. Chronic healed compression fractures at T7, T8, T9 and T12. Evidence of prior cement augmentation at T9 and T12. Additionally, SPECT CT imaging demonstrates extensive aortic atherosclerotic vascular calcifications. IMPRESSION: Focal activity within the L4 vertebral body combined with progressive height loss compared to prior imaging consistent with a non healed subacute fracture. Additional chronic/healed fractures and sites of prior cement augmentation as above. Electronically Signed   By: Wilkie Lent M.D.   On: 04/27/2024 09:54    Labs:  CBC: No results for input(s): WBC, HGB, HCT, PLT in the last 8760 hours.  COAGS: No results for input(s): INR, APTT in the last 8760 hours.  BMP: No results for input(s): NA, K, CL, CO2, GLUCOSE, BUN, CALCIUM , CREATININE, GFRNONAA, GFRAA in the last 8760 hours.  Invalid input(s): CMP  LIVER FUNCTION TESTS: No results for input(s): BILITOT, AST, ALT, ALKPHOS, PROT, ALBUMIN in the last 8760 hours.  TUMOR MARKERS: No results for input(s): AFPTM, CEA, CA199, CHROMGRNA in the last 8760 hours.  Assessment & Plan:   Patient has suffered subacute osteoporotic fracture of the L4 vertebra.   History and exam have demonstrated the following:  Acute/Subacute fracture by imaging dated 04/25/24, Pain on exam concordant with level of fracture, Failure of conservative therapy and pain refractory to narcotic pain mediation, and Significant disability on the [employer] Disability Questionnaire with 22/24 positive symptoms, reflecting significant impact/impairment of (ADLs)   ICD-10-CM Codes that Support Medical  Necessity (welshblog.at.[email or web address]?[email or web address])  M80.08XA    Age-related osteoporosis with current pathological fracture, vertebra(e), initial encounter for fracture   Plan:  L4 vertebral body augmentation with balloon kyphoplasty  Post-procedure disposition: inpatient   Medication holds: Xarelto  The patient has suffered a fracture of the L4 vertebral body. It is recommended that patients aged 36 years or older be evaluated for possible testing or treatment of osteoporosis. A copy of this consult report is sent to the patient's referring physician.     Total time spent on today's visit was over  40 Minutes  including both face-to-face time and non face-to-face time, personally spent on review of chart (including labs and relevant imaging), discussing further workup and treatment options, referral to specialist if needed, reviewing outside records if pertinent, answering patient questions, and coordinating care regarding [email or web address]    Age-related osteoporosis with current pathological fracture, vertebra(e), initial encounter for fracture as well as management strategy.    Electronically Signed: Wilkie POUR Mehreen Azizi 04/27/2024, 11:27 AM      "

## 2024-04-28 ENCOUNTER — Other Ambulatory Visit: Payer: Self-pay | Admitting: Physician Assistant

## 2024-04-28 ENCOUNTER — Telehealth: Payer: Self-pay | Admitting: Physician Assistant

## 2024-04-28 MED ORDER — HYDROCODONE-ACETAMINOPHEN 5-325 MG PO TABS: 1.0000 | ORAL_TABLET | Freq: Four times a day (QID) | ORAL | 0 refills | Status: AC | PRN

## 2024-04-28 NOTE — Telephone Encounter (Signed)
 Date 04/28/24 Provider FRISBIE Med Requesting: Hydrocodone  5-325mg  / Pt is looking to get enough meds to hold her over until her kyphoplasty. Pharmacy [facility] in [place] on [address]  Patient contact: [phone]

## 2024-04-28 NOTE — Telephone Encounter (Signed)
Patient notified about prescription.

## 2024-05-01 ENCOUNTER — Other Ambulatory Visit (HOSPITAL_COMMUNITY): Payer: Self-pay | Admitting: Interventional Radiology

## 2024-05-01 DIAGNOSIS — S32040A Wedge compression fracture of fourth lumbar vertebra, initial encounter for closed fracture: Secondary | ICD-10-CM

## 2024-05-02 ENCOUNTER — Other Ambulatory Visit: Payer: Self-pay | Admitting: Radiology

## 2024-05-02 DIAGNOSIS — M549 Dorsalgia, unspecified: Secondary | ICD-10-CM

## 2024-05-02 NOTE — H&P (Signed)
 "      Chief Complaint: Patient was seen in consultation today for L4 compression fx at the request of Frisbie,Brooke   Referring Physician(s): Frisbie,Brooke   Supervising Physician: Johann Sieving  Patient Status: [facility] - Out-pt  History of Present Illness: Sarah Herring is a 84 y.o. female with PMHs of HTN, COPD, A fib on Xarelto, osteoporosis, compression fx s/p T 12 KP by ortho in 2019, and new L4 compression fx who presents for KP.   Patient was seen by Dr. Karalee to discuss about treatment options for the new L4 compression fx, patient developed lower back pain and imaging showed L4 compression fx, she unfortunately sustained a fall which aggravated the back pain. A nuclear medicine bone scan with SPECT-CT was performed on 04/25/2024 and demonstrates focal activity within the L4 vertebral body combined with progressive height loss compared to prior consistent with a subacute but unhealed and progressive compression fracture. She was seen by Dr. Karalee on 04/27/24 when she was deemed a good candidate for L4 KP. Patient presents to [facility] IR today for L4 KP with Dr. Johann.   Denies fever, chills, shortness of breath, chest pain, nausea, vomiting, diarrhea, dizziness. Reports back pain.   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   Medtronic [id number]   Vertigo    when not taking metoprolol     Past Surgical History:  Procedure Laterality Date   ABLATION     CARDIOVERSION N/A 05/21/2016   Procedure: CARDIOVERSION;  Surgeon: Wolm JINNY Rhyme, MD;  Location: [facility] ORS;  Service: Cardiovascular;  Laterality: N/A;   CATARACT EXTRACTION W/PHACO Left  10/11/2019   Procedure: CATARACT EXTRACTION PHACO AND INTRAOCULAR LENS PLACEMENT (IOC) LEFT 8.00 01:04.2 12.4%;  Surgeon: Mittie Gaskin, MD;  Location: [facility];  Service: Ophthalmology;  Laterality: Left;   CATARACT EXTRACTION W/PHACO Right 11/01/2019   Procedure: CATARACT EXTRACTION PHACO AND INTRAOCULAR LENS PLACEMENT (IOC) RIGHT KAHOOK DUAL BLADE GONIOTOMY;  Surgeon: Mittie Gaskin, MD;  Location: [facility];  Service: Ophthalmology;  Laterality: Right;  10.69 1:11 15.1%   COLONOSCOPY     IR RADIOLOGIST EVAL & MGMT  04/27/2024   KYPHOPLASTY N/A 12/30/2017   Procedure: [id number];  Surgeon: Kathlynn Sharper, MD;  Location: [facility] ORS;  Service: Orthopedics;  Laterality: N/A;    Allergies: Amoxicillin-pot clavulanate, Azopt [brinzolamide], Betoptic [betaxolol], Brimonidine, Cardizem  [diltiazem ], Combigan [brimonidine tartrate-timolol ], Dust mite extract, Elemental sulfur, Metronidazole, Tramadol, Travatan [travoprost], Trusopt [dorzolamide], Hydrocodone , and Oxycodone  Medications: Prior to Admission medications  Medication Sig Start Date End Date Taking? Authorizing Provider  Ascorbic Acid (VITAMIN C) 1000 MG tablet Take 1,000 mg by mouth daily.    [provider]  atorvastatin  (LIPITOR) 20 MG tablet Take 20 mg by mouth daily.    [provider]  Calcium  Carb-Cholecalciferol (CALCIUM  600/VITAMIN D3 PO) Take 1 tablet by mouth daily.    [provider]  Cholecalciferol (VITAMIN D ) 2000 units CAPS Take 5,000 Units by mouth daily.     [provider]  fexofenadine (ALLEGRA) 180 MG tablet Take 180 mg by mouth daily.    [provider]  HYDROcodone -acetaminophen  (NORCO/VICODIN) 5-325 MG tablet Take 1 tablet by mouth every 6 (six) hours as needed for moderate pain (pain score 4-6). 04/28/24   Ulis Bottcher, PA-C  levothyroxine  (SYNTHROID , LEVOTHROID) 112 MCG tablet Take 112 mcg by mouth daily before breakfast.    [provider]  metoprolol  tartrate (LOPRESSOR ) 50 MG tablet Take 25 mg by mouth 2 (two) times a day.  06/27/18 03/29/24  [provider]  Multiple Vitamin (MULTIVITAMIN WITH MINERALS) TABS tablet Take 1 tablet by mouth daily.    [provider]  omeprazole (PRILOSEC) 20 MG capsule Take 20 mg by mouth daily.    [provider]  ondansetron  (ZOFRAN ) 4 MG tablet Take 1 tablet (4 mg total) by mouth every 8 (eight) hours as needed for nausea or vomiting. 03/30/24 03/30/25  Ulis Bottcher, PA-C  Polyvinyl Alcohol -Povidone PF (REFRESH) 1.4-0.6 % SOLN Place 1 drop into both eyes 2 (two) times daily as needed (for dry eyes).    [provider]  Potassium 99 MG TABS Take by mouth daily.    [provider]  timolol  (TIMOPTIC ) 0.5 % ophthalmic solution 1 drop daily.    [provider]  XARELTO 20 MG TABS tablet Take 20 mg by mouth. 08/18/21   [provider]     Family History  Problem Relation Age of Onset   Heart failure Father     Social History   Socioeconomic History   Marital status: Married    Spouse name: Not on file   Number of children: Not on file   Years of education: Not on file   Highest education level: Not on file  Occupational History   Not on file  Tobacco Use   Smoking status: Never   Smokeless tobacco: Never  Vaping Use   Vaping status: Never Used  Substance and Sexual Activity   Alcohol  use: Never   Drug use: No   Sexual activity: Not Currently  Other Topics Concern   Not on file  Social History Narrative   Not on file   Social Drivers of Health   Tobacco Use: Low Risk  (04/19/2024)   Received from [facility]   Patient History    Smoking Tobacco Use: Never    Smokeless Tobacco Use: Never    Passive Exposure: Not on file  Financial Resource Strain: Low Risk  (04/19/2024)   Received from [facility]   Overall Financial Resource Strain (CARDIA)    Difficulty of  Paying Living Expenses: Not hard at all  Food Insecurity: No Food Insecurity (04/19/2024)   Received from [facility]   Epic    Within the past 12 months, you worried that your food would run out before you got the money to buy more.: Never true    Within the past 12 months, the food you bought just didn't last and you didn't have money to get more.: Never true  Transportation Needs: No Transportation Needs (04/19/2024)   Received from [facility] - Transportation    In the past 12 months, has lack of transportation kept you from medical appointments or from getting medications?: No    Lack of Transportation (Non-Medical): No  Physical Activity: Not on file  Stress: Not on file  Social Connections: Not on file  Depression (EYV7-0): Not on file  Alcohol  Screen: Not on file  Housing: Low Risk  (04/19/2024)   Received from [facility]  Epic    In the last 12 months, was there a time when you were not able to pay the mortgage or rent on time?: No    In the past 12 months, how many times have you moved where you were living?: 0    At any time in the past 12 months, were you homeless or living in a shelter (including now)?: No  Utilities: Not At Risk (04/19/2024)   Received from [facility]   Epic    In the past 12 months has the electric, gas, oil, or water company threatened to shut off services in your home?: No  Health Literacy: Not on file     Review of Systems: A 12 point ROS discussed and pertinent positives are indicated in the HPI above.  All other systems are negative.  Vital Signs: BP (!) 150/74   Pulse 74   Temp 97.8 F (36.6 C) (Oral)   Resp 17   Ht 5' 3 (1.6 m)   Wt 127 lb (57.6 kg)   SpO2 100%   BMI 22.50 kg/m    Physical Exam HENT:     Mouth/Throat:     Mouth: Mucous membranes are moist.  Cardiovascular:     Rate and Rhythm: Normal rate.     Comments: Patient reports previous  pacemaker placement at [facility]:     Effort: Pulmonary effort is normal. No respiratory distress.     Breath sounds: Normal breath sounds.  Abdominal:     General: Bowel sounds are normal.     Palpations: Abdomen is soft.     Tenderness: There is no abdominal tenderness.  Musculoskeletal:     Right lower leg: Edema present.     Left lower leg: Edema present.     Comments: Non-pitting BLE edema  Skin:    General: Skin is warm and dry.  Neurological:     Mental Status: She is alert and oriented to person, place, and time.  Psychiatric:        Mood and Affect: Mood normal.        Behavior: Behavior normal.        Imaging: IR Radiologist Eval & Mgmt Result Date: 04/27/2024 EXAM: NEW PATIENT OFFICE VISIT CHIEF COMPLAINT: SEE NOTE IN EPIC HISTORY OF PRESENT ILLNESS: SEE NOTE IN EPIC REVIEW OF SYSTEMS: SEE NOTE IN EPIC PHYSICAL EXAMINATION: SEE NOTE IN EPIC ASSESSMENT AND PLAN: SEE NOTE IN EPIC Electronically Signed   By: Wilkie Lent M.D.   On: 04/27/2024 10:15   NM Bone W/Spect CT Result Date: 04/27/2024 CLINICAL DATA:  84 year old female with probable L4 compression fracture. EXAM: NM BONE SCAN AND SPECT IMAGING TECHNIQUE: After intravenous injection of radiopharmaceutical, delayed planar images were obtained in multiple projections. Additionally, delayed triplanar SPECT images were obtained through the area of interest. RADIOPHARMACEUTICALS:  21.69 mCi Tc-61m MDP COMPARISON:  CT scan of the lumbar spine dated 03/05/2024 FINDINGS: Focal radiotracer uptake is present within the L4 vertebral body consistent with a non healed subacute compression fracture. There has been significant interval height loss compared to the prior imaging. Height loss is now approximately 50% compared to 25% previously. No significant retropulsion. Chronic healed compression fractures at T7, T8, T9 and T12. Evidence of prior cement augmentation at T9 and T12. Additionally, SPECT CT imaging demonstrates  extensive aortic atherosclerotic vascular calcifications. IMPRESSION: Focal activity within the L4 vertebral body combined with progressive height loss compared to prior imaging consistent with a non healed subacute fracture. Additional  chronic/healed fractures and sites of prior cement augmentation as above. Electronically Signed   By: Wilkie Lent M.D.   On: 04/27/2024 09:54    Labs:  CBC: Recent Labs    05/03/24 1121  WBC 9.0  HGB 12.8  HCT 38.4  PLT 297    COAGS: Recent Labs    05/03/24 1121  INR 1.1    BMP: Recent Labs    05/03/24 1121  NA 134*  K 4.2  CL 97*  CO2 28  GLUCOSE 97  BUN 12  CALCIUM  9.3  CREATININE 0.80  GFRNONAA >60    LIVER FUNCTION TESTS: No results for input(s): BILITOT, AST, ALT, ALKPHOS, PROT, ALBUMIN in the last 8760 hours.  TUMOR MARKERS: No results for input(s): AFPTM, CEA, CA199, CHROMGRNA in the last 8760 hours.  Assessment and Plan: 84 y.o. female with PMHs of HTN, COPD, A fib on Xarelto, osteoporosis, compression fx s/p T 12 KP by ortho in 2019, and new L4 compression fx who presents for KP.   Advance Care Plan: no documents on file    NPO since 0730 this morning  VSS CBC unremarkable INR 1.1 AC/AP - last dose Xarelto 1/17 Allergies reviewed  Abx ordered  Risks and benefits of image guided L4 kyphoplasty were discussed with the patient including, but not limited to education regarding the natural healing process of compression fractures without intervention, bleeding, infection, cement migration which may cause spinal cord damage, paralysis, pulmonary embolism or even death.  This interventional procedure involves the use of X-rays and because of the nature of the planned procedure, it is possible that we will have prolonged use of X-ray fluoroscopy.  Potential radiation risks to you include (but are not limited to) the following: - A slightly elevated risk for cancer several years later in life.  This risk is typically less than 0.5% percent. This risk is low in comparison to the normal incidence of human cancer, which is 33% for women and 50% for men according to the [employer] Society. - Radiation induced injury can include skin redness, resembling a rash, tissue breakdown / ulcers and hair loss (which can be temporary or permanent).   The likelihood of either of these occurring depends on the difficulty of the procedure and whether you are sensitive to radiation due to previous procedures, disease, or genetic conditions.   IF your procedure requires a prolonged use of radiation, you will be notified and given written instructions for further action.  It is your responsibility to monitor the irradiated area for the 2 weeks following the procedure and to notify your physician if you are concerned that you have suffered a radiation induced injury.    All of the patient's questions were answered, patient is agreeable to proceed.  Consent signed and in chart.     Thank you for this interesting consult.  I greatly enjoyed meeting Sarah Herring and look forward to participating in their care.  A copy of this report was sent to the requesting provider on this date.  Electronically Signed: Cathleen Yagi B Elizet Kaplan, NP 05/03/2024, 12:52 PM   I spent a total of 30 Minutes in face to face in clinical consultation, greater than 50% of which was counseling/coordinating care for L4 kyphoplasty.  This chart was dictated using voice recognition software.  Despite best efforts to proofread,  errors can occur which can change the documentation meaning.   "

## 2024-05-03 ENCOUNTER — Ambulatory Visit (HOSPITAL_COMMUNITY)
Admission: RE | Admit: 2024-05-03 | Discharge: 2024-05-03 | Attending: Interventional Radiology | Admitting: Interventional Radiology

## 2024-05-03 ENCOUNTER — Other Ambulatory Visit: Payer: Self-pay

## 2024-05-03 DIAGNOSIS — S32040A Wedge compression fracture of fourth lumbar vertebra, initial encounter for closed fracture: Secondary | ICD-10-CM

## 2024-05-03 DIAGNOSIS — Z79899 Other long term (current) drug therapy: Secondary | ICD-10-CM | POA: Diagnosis not present

## 2024-05-03 DIAGNOSIS — M549 Dorsalgia, unspecified: Secondary | ICD-10-CM | POA: Diagnosis not present

## 2024-05-03 DIAGNOSIS — I4891 Unspecified atrial fibrillation: Secondary | ICD-10-CM | POA: Diagnosis not present

## 2024-05-03 DIAGNOSIS — M8008XA Age-related osteoporosis with current pathological fracture, vertebra(e), initial encounter for fracture: Secondary | ICD-10-CM | POA: Diagnosis not present

## 2024-05-03 DIAGNOSIS — J4489 Other specified chronic obstructive pulmonary disease: Secondary | ICD-10-CM | POA: Insufficient documentation

## 2024-05-03 DIAGNOSIS — Z7901 Long term (current) use of anticoagulants: Secondary | ICD-10-CM | POA: Insufficient documentation

## 2024-05-03 DIAGNOSIS — I10 Essential (primary) hypertension: Secondary | ICD-10-CM | POA: Insufficient documentation

## 2024-05-03 HISTORY — PX: IR KYPHO LUMBAR INC FX REDUCE BONE BX UNI/BIL CANNULATION INC/IMAGING: IMG5519

## 2024-05-03 LAB — CBC
HCT: 38.4 % (ref 36.0–46.0)
Hemoglobin: 12.8 g/dL (ref 12.0–15.0)
MCH: 32 pg (ref 26.0–34.0)
MCHC: 33.3 g/dL (ref 30.0–36.0)
MCV: 96 fL (ref 80.0–100.0)
Platelets: 297 K/uL (ref 150–400)
RBC: 4 MIL/uL (ref 3.87–5.11)
RDW: 13.5 % (ref 11.5–15.5)
WBC: 9 K/uL (ref 4.0–10.5)
nRBC: 0 % (ref 0.0–0.2)

## 2024-05-03 LAB — BASIC METABOLIC PANEL WITH GFR
Anion gap: 9 (ref 5–15)
BUN: 12 mg/dL (ref 8–23)
CO2: 28 mmol/L (ref 22–32)
Calcium: 9.3 mg/dL (ref 8.9–10.3)
Chloride: 97 mmol/L — ABNORMAL LOW (ref 98–111)
Creatinine, Ser: 0.8 mg/dL (ref 0.44–1.00)
GFR, Estimated: >60 mL/min
Glucose, Bld: 97 mg/dL (ref 70–99)
Potassium: 4.2 mmol/L (ref 3.5–5.1)
Sodium: 134 mmol/L — ABNORMAL LOW (ref 135–145)

## 2024-05-03 LAB — PROTIME-INR
INR: 1.1 (ref 0.8–1.2)
Prothrombin Time: 14.8 s (ref 11.4–15.2)

## 2024-05-03 MED ORDER — MIDAZOLAM HCL 2 MG/2ML IJ SOLN
INTRAMUSCULAR | Status: AC
  Filled 2024-05-03: qty 2

## 2024-05-03 MED ORDER — LIDOCAINE HCL (PF) 1 % IJ SOLN
INTRAMUSCULAR | Status: AC
  Filled 2024-05-03: qty 30

## 2024-05-03 MED ORDER — FENTANYL CITRATE (PF) 100 MCG/2ML IJ SOLN
INTRAMUSCULAR | Status: AC | PRN
  Administered 2024-05-03 (×2): 25 ug via INTRAVENOUS
  Administered 2024-05-03: 50 ug via INTRAVENOUS

## 2024-05-03 MED ORDER — VANCOMYCIN HCL IN DEXTROSE 1-5 GM/200ML-% IV SOLN: 1000.0000 mg | INTRAVENOUS | Status: DC

## 2024-05-03 MED ORDER — CEFAZOLIN SODIUM-DEXTROSE 2-4 GM/100ML-% IV SOLN
INTRAVENOUS | Status: AC
  Filled 2024-05-03: qty 100

## 2024-05-03 MED ORDER — MIDAZOLAM HCL (PF) 2 MG/2ML IJ SOLN
INTRAMUSCULAR | Status: AC | PRN
  Administered 2024-05-03: .5 mg via INTRAVENOUS
  Administered 2024-05-03: 1 mg via INTRAVENOUS
  Administered 2024-05-03: .5 mg via INTRAVENOUS

## 2024-05-03 MED ORDER — CEFAZOLIN SODIUM-DEXTROSE 2-4 GM/100ML-% IV SOLN
2.0000 g | Freq: Once | INTRAVENOUS | Status: AC
  Administered 2024-05-03: 2 g via INTRAVENOUS

## 2024-05-03 MED ORDER — SODIUM CHLORIDE 0.9 % IV SOLN: INTRAVENOUS | Status: DC

## 2024-05-03 MED ORDER — FENTANYL CITRATE (PF) 100 MCG/2ML IJ SOLN
INTRAMUSCULAR | Status: AC
  Filled 2024-05-03: qty 2

## 2024-05-03 NOTE — Progress Notes (Signed)
 Patient and patient daughter given discharge instructions, education provided no further questions at this time. Patient able to ambulate and void before discharge. Able to tolerate PO intake. Patient sites are clean, dry, intact with no hematoma noted upon discharge.

## 2024-05-03 NOTE — Procedures (Signed)
" °  Procedure:  Lumbar L4 kyphoplasty  Operators:   Johann Balder    Preprocedure diagnosis: Diagnoses of Closed compression fracture of L4 lumbar vertebra, initial encounter (HCC) and Back pain, unspecified back location, unspecified back pain laterality, unspecified chronicity were pertinent to this visit. Postprocedure diagnosis: same EBL:    minimal Complications:   none immediate  See full dictation in [employer].  CHARM Toribio Johann MD Main # [phone] Pager  [phone] Mobile [phone]    "

## 2024-05-03 NOTE — Progress Notes (Addendum)
 Verified with PA Wyatt in IR patient d/c medications unable to reconcile AVS at this time but educated RN when to resume medications. Will educated patient and family as well.
